# Patient Record
Sex: Male | Born: 1960
Health system: Southern US, Community
[De-identification: ages and names within clinical notes are randomized; demographics above are authoritative.]

## PROBLEM LIST (undated history)

## (undated) DIAGNOSIS — H3530 Unspecified macular degeneration: Secondary | ICD-10-CM

## (undated) HISTORY — DX: Unspecified macular degeneration: H35.30

---

## 2002-07-06 ENCOUNTER — Encounter: Payer: Self-pay | Admitting: Emergency Medicine

## 2002-07-06 ENCOUNTER — Emergency Department (HOSPITAL_COMMUNITY): Admission: EM | Admit: 2002-07-06 | Discharge: 2002-07-06 | Payer: Self-pay | Admitting: Emergency Medicine

## 2009-06-02 ENCOUNTER — Ambulatory Visit (HOSPITAL_BASED_OUTPATIENT_CLINIC_OR_DEPARTMENT_OTHER): Admission: RE | Admit: 2009-06-02 | Discharge: 2009-06-02 | Payer: Self-pay | Admitting: Orthopedic Surgery

## 2010-10-06 LAB — POCT HEMOGLOBIN-HEMACUE: Hemoglobin: 16.6 g/dL (ref 13.0–17.0)

## 2018-06-18 ENCOUNTER — Ambulatory Visit (HOSPITAL_COMMUNITY): Payer: BLUE CROSS/BLUE SHIELD | Attending: Sports Medicine | Admitting: Occupational Therapy

## 2018-06-18 ENCOUNTER — Encounter (HOSPITAL_COMMUNITY): Payer: Self-pay | Admitting: Occupational Therapy

## 2018-06-18 ENCOUNTER — Other Ambulatory Visit: Payer: Self-pay

## 2018-06-18 DIAGNOSIS — R29898 Other symptoms and signs involving the musculoskeletal system: Secondary | ICD-10-CM

## 2018-06-18 DIAGNOSIS — M25612 Stiffness of left shoulder, not elsewhere classified: Secondary | ICD-10-CM | POA: Insufficient documentation

## 2018-06-18 DIAGNOSIS — M25512 Pain in left shoulder: Secondary | ICD-10-CM | POA: Insufficient documentation

## 2018-06-18 NOTE — Patient Instructions (Signed)
SHOULDER: Flexion On Table   Place hands on towel placed on table, elbows straight. Lean forward with you upper body, pushing towel away from body.  _10__ reps per set, __3_ sets per day  Abduction (Passive)   With arm out to side, resting on towel placed on table, keeping trunk away from table, lean to the side while pushing towel away from body.  Repeat __10__ times. Do __3__ sessions per day.  Copyright  VHI. All rights reserved.     Internal Rotation (Assistive)   Seated with elbow bent at right angle and held against side, slide arm on table surface in an inward arc keeping elbow anchored in place. Repeat __10__ times. Do __2-3__ sessions per day. Activity: Use this motion to brush crumbs off the table.  Copyright  VHI. All rights reserved.     1) Seated Row   Sit up straight with elbows by your sides. Pull back with shoulders/elbows, keeping forearms straight, as if pulling back on the reins of a horse. Squeeze shoulder blades together. Repeat __10_times, __3__sets/day    2) Shoulder Elevation    Sit up straight with arms by your sides. Slowly bring your shoulders up towards your ears. Repeat_10__times, __3__ sets/day    3) Shoulder Extension    Sit up straight with both arms by your side, draw your arms back behind your waist. Keep your elbows straight. Repeat _10___times, _3___sets/day.
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
## 2018-06-21 ENCOUNTER — Encounter (HOSPITAL_COMMUNITY): Payer: Self-pay

## 2018-06-21 ENCOUNTER — Ambulatory Visit (HOSPITAL_COMMUNITY): Payer: BLUE CROSS/BLUE SHIELD

## 2018-06-21 DIAGNOSIS — M25512 Pain in left shoulder: Secondary | ICD-10-CM | POA: Diagnosis not present

## 2018-06-21 DIAGNOSIS — R29898 Other symptoms and signs involving the musculoskeletal system: Secondary | ICD-10-CM

## 2018-06-21 DIAGNOSIS — M25612 Stiffness of left shoulder, not elsewhere classified: Secondary | ICD-10-CM

## 2018-06-21 NOTE — Therapy (Signed)
[place], [place], [address] Phone: [phone]   Fax:  [phone]  Occupational Therapy Treatment  Patient Details  Name: Benjamin Gill MRN: [id number] Date of Birth: 1961/04/08 Referring Provider (OT): Dr. Becky Sax   Encounter Date: 06/21/2018  OT End of Session - 06/21/18 1454    Visit Number  2    Number of Visits  16    Date for OT Re-Evaluation  08/17/18   mini-reassessment 07/16/17   Authorization Type  BCBS    Authorization Time Period  no visit limit    OT Start Time  1356   pt was checked in late   OT Stop Time  1430    OT Time Calculation (min)  34 min    Activity Tolerance  Patient tolerated treatment well    Behavior During Therapy  [facility] for tasks assessed/performed       History reviewed. No pertinent past medical history.  History reviewed. No pertinent surgical history.  There were no vitals filed for this visit.  Subjective Assessment - 06/21/18 1423    Subjective   S: I can't sleep in the bed yet and i wish I could.     Special Tests  FOTO score: 42/100    Currently in Pain?  Yes    Pain Score  3     Pain Location  Shoulder    Pain Orientation  Left    Pain Descriptors / Indicators  Aching    Pain Type  Acute pain    Pain Radiating Towards  N/A    Pain Onset  Yesterday    Pain Frequency  Constant    Aggravating Factors   Wrong movements    Pain Relieving Factors  movement/stretching    Effect of Pain on Daily Activities  unable to use his left arm for any daily tasks.     Multiple Pain Sites  No         [facility] OT Assessment - 06/21/18 1421      Assessment   Medical Diagnosis  s/p closed displaced clavicle shaft fx      Precautions   Precautions  Shoulder    Type of Shoulder Precautions  genle ROM all planes-table slides, pendulums      Observation/Other Assessments   Focus on Therapeutic Outcomes (FOTO)   42/100               OT Treatments/Exercises (OP)  - 06/21/18 1426      Exercises   Exercises  Shoulder      Shoulder Exercises: Supine   Protraction  PROM;10 reps    Horizontal ABduction  PROM;10 reps    External Rotation  PROM;10 reps    Internal Rotation  PROM;10 reps    Flexion  PROM;10 reps    ABduction  PROM;10 reps      Shoulder Exercises: Seated   Row  AROM;10 reps    Other Seated Exercises  Scapular depression; 10X; A/ROM      Shoulder Exercises: Therapy Ball   Flexion  10 reps    ABduction  10 reps      Shoulder Exercises: ROM/Strengthening   Thumb Tacks  1' low level      Manual Therapy   Manual Therapy  Soft tissue mobilization    Manual therapy comments  Manual therapy completed prior to exercises.     Soft tissue mobilization  Myofascial release and manual stretching completed to left upper arm,  trapezius, and scapularis region to decrease fascial restrictions and increase joint mobility in a pain free zone.             OT Education - 06/21/18 1454    Education Details  reviewed therapy goals and treatment plan with patient.     Person(s) Educated  Patient    Methods  Explanation    Comprehension  Verbalized understanding       OT Short Term Goals - 06/21/18 1513      OT SHORT TERM GOAL #1   Title  Pt will be provided with and educated on HEP to improve ability to use LUE during ADL tasks.     Time  4    Period  Weeks    Status  On-going      OT SHORT TERM GOAL #2   Title  Pt will improve P/ROM of LUE to [facility] to improve ability to use LUE as assist during ADL completion.     Time  4    Period  Weeks    Status  On-going      OT SHORT TERM GOAL #3   Title  Pt will improve LUE strength to 3+/5 to improve ability to reach for items above waist level.     Time  4    Period  Weeks    Status  On-going      OT SHORT TERM GOAL #4   Title  Pt will decrease pain in LUE to 4/10 to improve ability to sleep in his bed.     Time  4    Period  Weeks    Status  On-going        OT Long Term Goals -  06/21/18 1513      OT LONG TERM GOAL #1   Title  Pt will return to highest level of functioning using LUE as non-dominant during ADL and work tasks.     Time  8    Period  Weeks    Status  On-going      OT LONG TERM GOAL #2   Title  Pt will decrease pain in LUE to 2/10 or less to improve ability to using LUE as non-dominant during dressing tasks.     Time  8    Period  Weeks    Status  On-going      OT LONG TERM GOAL #3   Title  Pt will improve A/ROM of LUE to [facility] to improve ability to reach for items overhead and behind back.     Time  8    Period  Weeks    Status  On-going      OT LONG TERM GOAL #4   Title  Pt will increase strength in LUE to 4+/5 to improve ability to lift and carry weighted objects during ADL and work tasks.     Time  8    Period  Weeks    Status  On-going      OT LONG TERM GOAL #5   Title  Pt will decrease LUE fascial restrictions from mod to minimal amounts or less to improve mobility required for functional reaching tasks.     Time  8    Period  Weeks    Status  On-going            Plan - 06/21/18 1457    Clinical Impression Statement  A: initiated myofascial release, manual stretching, and therapy ball stretching. patient with a large  trigger point in the left upper trapezius region with great results with trigger point release. Education completed regarding trigger points, muscle knots, and how they occur. VC for form and technique were provided during session.     Plan  P: Add isometrics supine.     Consulted and Agree with Plan of Care  Patient       Patient will benefit from skilled therapeutic intervention in order to improve the following deficits and impairments:  Decreased range of motion, Decreased activity tolerance, Increased fascial restrictions, Impaired UE functional use, Pain, Decreased mobility, Decreased strength  Visit Diagnosis: Acute pain of left shoulder  Stiffness of left shoulder, not elsewhere classified  Other  symptoms and signs involving the musculoskeletal system    Problem List There are no active problems to display for this patient.  Ailene Ravel, OTR/L,CBIS  [phone]  06/21/2018, 3:15 PM  [place] [address] [place], [place], [address] Phone: [phone]   Fax:  [phone]  Name: JOSHU FURUKAWA MRN: [id number] Date of Birth: [birth year]

## 2018-06-26 ENCOUNTER — Encounter (HOSPITAL_COMMUNITY): Payer: Self-pay | Admitting: Specialist

## 2018-06-26 ENCOUNTER — Ambulatory Visit (HOSPITAL_COMMUNITY): Payer: BLUE CROSS/BLUE SHIELD | Admitting: Specialist

## 2018-06-26 DIAGNOSIS — M25612 Stiffness of left shoulder, not elsewhere classified: Secondary | ICD-10-CM

## 2018-06-26 DIAGNOSIS — M25512 Pain in left shoulder: Secondary | ICD-10-CM | POA: Diagnosis not present

## 2018-06-26 DIAGNOSIS — R29898 Other symptoms and signs involving the musculoskeletal system: Secondary | ICD-10-CM

## 2018-06-26 NOTE — Patient Instructions (Signed)
  Hold each contraction 5-10" repeat 5 times each session.  Complete 2 sessions per day  Strengthening: Isometric Flexion  Using wall for resistance, press left fist into ball using light pressure. Hold ____ seconds. Repeat ____ times per set. Do ____ sets per session. Do ____ sessions per day.  SHOULDER: Abduction (Isometric)  Use wall as resistance. Press left arm against pillow. Keep elbow straight. Hold ___ seconds. ___ reps per set, ___ sets per day, ___ days per week  Extension (Isometric)  Place left bent elbow and back of arm against wall. Press elbow against wall. Hold ____ seconds. Repeat ____ times. Do ____ sessions per day.  Internal Rotation (Isometric)  Place palm of left fist against door frame, with elbow bent. Press fist against door frame. Hold ____ seconds. Repeat ____ times. Do ____ sessions per day.  External Rotation (Isometric)  Place back of left fist against door frame, with elbow bent. Press fist against door frame. Hold ____ seconds. Repeat ____ times. Do ____ sessions per day.  Copyright  VHI. All rights reserved.

## 2018-06-26 NOTE — Therapy (Signed)
[place], [place], [address] Phone: [phone]   Fax:  [phone]  Occupational Therapy Treatment  Patient Details  Name: Benjamin Gill MRN: [id number] Date of Birth: 16-Nov-1960 Referring Provider (OT): Dr. Becky Sax   Encounter Date: 06/26/2018  OT End of Session - 06/26/18 0935    Visit Number  3    Number of Visits  16    Date for OT Re-Evaluation  08/17/18   mini reassessment on 07/16/17   Authorization Type  BCBS    Authorization Time Period  no visit limit    OT Start Time  0900    OT Stop Time  0940    OT Time Calculation (min)  40 min    Activity Tolerance  Patient tolerated treatment well    Behavior During Therapy  [facility] for tasks assessed/performed       History reviewed. No pertinent past medical history.  History reviewed. No pertinent surgical history.  There were no vitals filed for this visit.  Subjective Assessment - 06/26/18 0934    Subjective   S:  I have some knots in the back of my shoulder blade, I rub it against the corner and that helps    Currently in Pain?  Yes    Pain Score  3     Pain Location  Shoulder    Pain Orientation  Left;Posterior    Pain Descriptors / Indicators  Aching;Burning         [facility] OT Assessment - 06/26/18 0001      Assessment   Medical Diagnosis  s/p closed displaced clavicle shaft fx      Precautions   Precautions  Shoulder    Type of Shoulder Precautions  genle ROM all planes-table slides, pendulums               OT Treatments/Exercises (OP) - 06/26/18 0001      Shoulder Exercises: Supine   Protraction  PROM;10 reps    Horizontal ABduction  PROM;10 reps    External Rotation  PROM;10 reps    Internal Rotation  PROM;10 reps    Flexion  PROM;10 reps    ABduction  PROM;10 reps    Other Supine Exercises  bridges X15      Shoulder Exercises: Seated   Elevation  AROM;12 reps    Extension  AROM;12 reps    Row  AROM;12 reps      Shoulder Exercises: Therapy Ball   Flexion  15 reps    ABduction  15 reps      Shoulder Exercises: ROM/Strengthening   Thumb Tacks  1' low level      Shoulder Exercises: Isometric Strengthening   Flexion  Supine;3X3"    Extension  Supine;3X3"    External Rotation  Supine;3X3"    Internal Rotation  Supine;3X3"    ABduction  Supine;3X3"    ADduction  Supine;3X3"      Manual Therapy   Manual Therapy  Myofascial release    Manual therapy comments  Manual therapy completed prior to exercises.     Soft tissue mobilization  Myofascial release and manual stretching completed to left upper arm, trapezius, and scapularis region to decrease fascial restrictions and increase joint mobility in a pain free zone.    Myofascial Release  myofascial release and manual traction to cervical region and left trapezius region to decrease pain and restrictions and improve pain free mobility  OT Education - 06/26/18 0933    Education Details  isometric strengthening in standing     Person(s) Educated  Patient    Methods  Explanation;Demonstration;Handout    Comprehension  Verbalized understanding;Returned demonstration       OT Short Term Goals - 06/21/18 1513      OT SHORT TERM GOAL #1   Title  Pt will be provided with and educated on HEP to improve ability to use LUE during ADL tasks.     Time  4    Period  Weeks    Status  On-going      OT SHORT TERM GOAL #2   Title  Pt will improve P/ROM of LUE to [facility] to improve ability to use LUE as assist during ADL completion.     Time  4    Period  Weeks    Status  On-going      OT SHORT TERM GOAL #3   Title  Pt will improve LUE strength to 3+/5 to improve ability to reach for items above waist level.     Time  4    Period  Weeks    Status  On-going      OT SHORT TERM GOAL #4   Title  Pt will decrease pain in LUE to 4/10 to improve ability to sleep in his bed.     Time  4    Period  Weeks    Status  On-going        OT Long  Term Goals - 06/21/18 1513      OT LONG TERM GOAL #1   Title  Pt will return to highest level of functioning using LUE as non-dominant during ADL and work tasks.     Time  8    Period  Weeks    Status  On-going      OT LONG TERM GOAL #2   Title  Pt will decrease pain in LUE to 2/10 or less to improve ability to using LUE as non-dominant during dressing tasks.     Time  8    Period  Weeks    Status  On-going      OT LONG TERM GOAL #3   Title  Pt will improve A/ROM of LUE to [facility] to improve ability to reach for items overhead and behind back.     Time  8    Period  Weeks    Status  On-going      OT LONG TERM GOAL #4   Title  Pt will increase strength in LUE to 4+/5 to improve ability to lift and carry weighted objects during ADL and work tasks.     Time  8    Period  Weeks    Status  On-going      OT LONG TERM GOAL #5   Title  Pt will decrease LUE fascial restrictions from mod to minimal amounts or less to improve mobility required for functional reaching tasks.     Time  8    Period  Weeks    Status  On-going            Plan - 06/26/18 0939    Clinical Impression Statement  A: Initiated isometric strengthening in supine, 3X3" hold.  Patient completed with good form and technique, therefore added to HEP.  Manual therapy indicated for scapular region and cervical region due to large fascial restrictions present and causing increased pain and restrictions.  Plan  P:  Increase isometric contraction to 5" or greater.  Continue manual therapy for improved P/ROM required to regain functional use of left arm during ADL completion.        Patient will benefit from skilled therapeutic intervention in order to improve the following deficits and impairments:  Decreased range of motion, Decreased activity tolerance, Increased fascial restrictions, Impaired UE functional use, Pain, Decreased mobility, Decreased strength  Visit Diagnosis: Acute pain of left shoulder  Stiffness of  left shoulder, not elsewhere classified  Other symptoms and signs involving the musculoskeletal system    Problem List There are no active problems to display for this patient.   Vangie Bicker, Vincent, OTR/L [phone]  06/26/2018, 9:43 AM  [place] [address] [place], [place], [address] Phone: [phone]   Fax:  [phone]  Name: Benjamin Gill MRN: [id number] Date of Birth: Aug 06, 1960

## 2018-06-29 ENCOUNTER — Ambulatory Visit (HOSPITAL_COMMUNITY): Payer: BLUE CROSS/BLUE SHIELD | Admitting: Specialist

## 2018-06-29 ENCOUNTER — Encounter (HOSPITAL_COMMUNITY): Payer: Self-pay | Admitting: Specialist

## 2018-06-29 DIAGNOSIS — M25512 Pain in left shoulder: Secondary | ICD-10-CM

## 2018-06-29 DIAGNOSIS — M25612 Stiffness of left shoulder, not elsewhere classified: Secondary | ICD-10-CM

## 2018-06-29 DIAGNOSIS — R29898 Other symptoms and signs involving the musculoskeletal system: Secondary | ICD-10-CM

## 2018-06-29 NOTE — Therapy (Signed)
[place], [place], [address] Phone: [phone]   Fax:  [phone]  Occupational Therapy Treatment  Patient Details  Name: Benjamin Gill MRN: [id number] Date of Birth: 06-Dec-1960 Referring Provider (OT): Dr. Becky Sax   Encounter Date: 06/29/2018  OT End of Session - 06/29/18 0944    Visit Number  4    Number of Visits  16    Date for OT Re-Evaluation  08/17/18   mini reassessment on 07/16/18   Authorization Type  [employer]    Authorization Time Period  no visit limit    OT Start Time  0905    OT Stop Time  0950    OT Time Calculation (min)  45 min    Activity Tolerance  Patient tolerated treatment well    Behavior During Therapy  [facility] for tasks assessed/performed       History reviewed. No pertinent past medical history.  History reviewed. No pertinent surgical history.  There were no vitals filed for this visit.  Subjective Assessment - 06/29/18 0944    Subjective   S:  I really like the bridges, they make my shoulder feel good.  I have been doing the isometrics at home.     Currently in Pain?  Yes    Pain Score  3     Pain Location  Shoulder    Pain Orientation  Left;Posterior    Pain Descriptors / Indicators  Aching                   OT Treatments/Exercises (OP) - 06/29/18 0001      Exercises   Exercises  Shoulder      Shoulder Exercises: Supine   Protraction  PROM;5 reps;AAROM;10 reps    Horizontal ABduction  PROM;5 reps;AAROM;10 reps    External Rotation  PROM;5 reps;AAROM;10 reps    Internal Rotation  PROM;5 reps;AAROM;10 reps    Flexion  PROM;5 reps;AAROM;10 reps    ABduction  PROM;5 reps;AAROM;10 reps    Other Supine Exercises  bridges X 20      Shoulder Exercises: Seated   Elevation  AROM;15 reps    Extension  AROM;15 reps    Row  AROM;15 reps      Shoulder Exercises: ROM/Strengthening   Thumb Tacks  1' low level    Prot/Ret//Elev/Dep  1' low level      Shoulder  Exercises: Isometric Strengthening   Flexion  Supine;5X5"    Extension  Supine;5X5"    External Rotation  Supine;5X5"    Internal Rotation  Supine;5X5"    ABduction  Supine;5X5"    ADduction  Supine;5X5"      Manual Therapy   Manual Therapy  Myofascial release    Manual therapy comments  Manual therapy completed prior to exercises.     Soft tissue mobilization  Myofascial release and manual stretching completed to left upper arm, trapezius, and scapularis region to decrease fascial restrictions and increase joint mobility in a pain free zone.    Myofascial Release  myofascial release and manual traction to cervical region and left trapezius region to decrease pain and restrictions and improve pain free mobility              OT Education - 06/29/18 0954    Education Details  cervical flexibility stretches    Person(s) Educated  Patient    Methods  Explanation;Demonstration;Handout    Comprehension  Verbalized understanding;Returned demonstration  OT Short Term Goals - 06/21/18 1513      OT SHORT TERM GOAL #1   Title  Pt will be provided with and educated on HEP to improve ability to use LUE during ADL tasks.     Time  4    Period  Weeks    Status  On-going      OT SHORT TERM GOAL #2   Title  Pt will improve P/ROM of LUE to [facility] to improve ability to use LUE as assist during ADL completion.     Time  4    Period  Weeks    Status  On-going      OT SHORT TERM GOAL #3   Title  Pt will improve LUE strength to 3+/5 to improve ability to reach for items above waist level.     Time  4    Period  Weeks    Status  On-going      OT SHORT TERM GOAL #4   Title  Pt will decrease pain in LUE to 4/10 to improve ability to sleep in his bed.     Time  4    Period  Weeks    Status  On-going        OT Long Term Goals - 06/21/18 1513      OT LONG TERM GOAL #1   Title  Pt will return to highest level of functioning using LUE as non-dominant during ADL and work tasks.      Time  8    Period  Weeks    Status  On-going      OT LONG TERM GOAL #2   Title  Pt will decrease pain in LUE to 2/10 or less to improve ability to using LUE as non-dominant during dressing tasks.     Time  8    Period  Weeks    Status  On-going      OT LONG TERM GOAL #3   Title  Pt will improve A/ROM of LUE to [facility] to improve ability to reach for items overhead and behind back.     Time  8    Period  Weeks    Status  On-going      OT LONG TERM GOAL #4   Title  Pt will increase strength in LUE to 4+/5 to improve ability to lift and carry weighted objects during ADL and work tasks.     Time  8    Period  Weeks    Status  On-going      OT LONG TERM GOAL #5   Title  Pt will decrease LUE fascial restrictions from mod to minimal amounts or less to improve mobility required for functional reaching tasks.     Time  8    Period  Weeks    Status  On-going            Plan - 06/29/18 7169    Clinical Impression Statement  A:  Increased contraction time for isometrics to 5 X 5".  P/ROM is WFL, therefore added AA/ROM this date in supine. Also added cervical stretches as patient feeling a good deal of tightness and pain in his cervical region and left upper trapezius.    Plan  P:  Follow up on HEP, add aa/rom in seated.  Continue to improve left arm range and strength needed to regain full functional use of left arm for ADL tasks.     Consulted and Agree with Plan  of Care  Patient       Patient will benefit from skilled therapeutic intervention in order to improve the following deficits and impairments:  Decreased range of motion, Decreased activity tolerance, Increased fascial restrictions, Impaired UE functional use, Pain, Decreased mobility, Decreased strength  Visit Diagnosis: Acute pain of left shoulder  Stiffness of left shoulder, not elsewhere classified  Other symptoms and signs involving the musculoskeletal system    Problem List There are no active problems to display  for this patient.   Vangie Bicker, [place], OTR/L [phone]  06/29/2018, 10:00 AM  [place], [place], [address] Phone: [phone]   Fax:  [phone]  Name: HEATHER STREEPER MRN: [id number] Date of Birth: 1960-09-06

## 2018-06-29 NOTE — Patient Instructions (Addendum)
Flexibility: Neck Stretch   Grasp left arm above wrist and pull down across body while gently tilting head same direction. Hold for 3 seconds. Repeat 10 times.   Levator Scapula Stretch   Place left hand on same side shoulder blade. With other hand, gently stretch head down and away. Hold 3 seconds. Repeat 10 times.   Flexibility: Neck Stretch   Grasp left arm above wrist and pull down across body while gently tilting head same direction. Hold 3 seconds. Repeat 10 times.  Flexibility: Neck Retraction   Pull head straight back, keeping eyes and jaw level. *Give yourself a double chin.* Hold 3 seconds. Repeat 10 times.   Lower Cervical / Upper Thoracic Stretch   Clasp hands together in front with arms extended. Gently pull shoulder blades apart and bend head forward. Hold 3 seconds. Repeat 10 times.        Flexibility: Upper Trapezius Stretch   Gently grasp right side of head while reaching behind back with other hand. Tilt head away until a gentle stretch is felt. Hold ____ seconds. Repeat ____ times per set. Do ____ sets per session. Do ____ sessions per day.  http://orth.exer.us/340   Levator Stretch   Grasp seat or sit on hand on side to be stretched. Turn head toward other side and look down. Use hand on head to gently stretch neck in that position. Hold ____ seconds. Repeat on other side. Repeat ____ times. Do ____ sessions per day.  http://[email or web address].exer.us/30   Scapular Retraction (Standing)   With arms at sides, pinch shoulder blades together. Repeat ____ times per set. Do ____ sets per session. Do ____ sessions per day.  http://orth.exer.us/944   Flexibility: Neck Retraction   Pull head straight back, keeping eyes and jaw level. Repeat ____ times per set. Do ____ sets per session. Do ____ sessions per day.  http://orth.exer.us/344   Posture - Sitting   Sit upright, head facing forward. Try using a roll to support lower back. Keep shoulders  relaxed, and avoid rounded back. Keep hips level with knees. Avoid crossing legs for long periods.   Flexibility: Corner Stretch   Standing in corner with hands just above shoulder level and feet ____ inches from corner, lean forward until a comfortable stretch is felt across chest. Hold ____ seconds. Repeat ____ times per set. Do ____ sets per session. Do ____ sessions per day.  http://orth.exer.us/342   Copyright  VHI. All rights reserved.

## 2018-07-02 ENCOUNTER — Telehealth (HOSPITAL_COMMUNITY): Payer: Self-pay | Admitting: *Deleted

## 2018-07-02 NOTE — Telephone Encounter (Signed)
07/02/18  has a drs appt in [place] and needs an early morning or late afternoon appt on Thursday if something comes open or on Friday

## 2018-07-03 ENCOUNTER — Encounter (HOSPITAL_COMMUNITY): Payer: Self-pay

## 2018-07-03 ENCOUNTER — Ambulatory Visit (HOSPITAL_COMMUNITY): Payer: BLUE CROSS/BLUE SHIELD

## 2018-07-03 DIAGNOSIS — M25612 Stiffness of left shoulder, not elsewhere classified: Secondary | ICD-10-CM

## 2018-07-03 DIAGNOSIS — R29898 Other symptoms and signs involving the musculoskeletal system: Secondary | ICD-10-CM

## 2018-07-03 DIAGNOSIS — M25512 Pain in left shoulder: Secondary | ICD-10-CM | POA: Diagnosis not present

## 2018-07-03 NOTE — Patient Instructions (Signed)

## 2018-07-03 NOTE — Therapy (Signed)
[place], [place], [address] Phone: [phone]   Fax:  [phone]  Occupational Therapy Treatment  Patient Details  Name: Benjamin Gill MRN: [id number] Date of Birth: 11/26/1960 Referring Provider (OT): Dr. Becky Sax   Encounter Date: 07/03/2018  OT End of Session - 07/03/18 1445    Visit Number  5    Number of Visits  16    Date for OT Re-Evaluation  08/17/18   mini reassessment on 07/16/18   Authorization Type  BCBS    Authorization Time Period  no visit limit    OT Start Time  1301    OT Stop Time  1345    OT Time Calculation (min)  44 min    Activity Tolerance  Patient tolerated treatment well    Behavior During Therapy  [facility] for tasks assessed/performed       History reviewed. No pertinent past medical history.  History reviewed. No pertinent surgical history.  There were no vitals filed for this visit.  Subjective Assessment - 07/03/18 1319    Subjective   S: It's just a little sore today.    Currently in Pain?  Yes    Pain Score  3     Pain Location  Shoulder    Pain Orientation  Left    Pain Descriptors / Indicators  Aching;Sore    Pain Type  Acute pain    Pain Radiating Towards  N/A    Pain Onset  In the past 7 days    Pain Frequency  Constant    Aggravating Factors   wrong movements    Pain Relieving Factors  movment/stretching    Effect of Pain on Daily Activities  moderate effect    Multiple Pain Sites  No         [facility] OT Assessment - 07/03/18 1320      Assessment   Medical Diagnosis  s/p closed displaced clavicle shaft fx      Precautions   Precautions  Shoulder    Type of Shoulder Precautions  genle ROM all planes-table slides, pendulums               OT Treatments/Exercises (OP) - 07/03/18 1320      Exercises   Exercises  Shoulder      Shoulder Exercises: Supine   Protraction  PROM;5 reps;AAROM;10 reps    Horizontal ABduction  PROM;5 reps;AAROM;10 reps     External Rotation  PROM;5 reps;AAROM;10 reps    Internal Rotation  PROM;5 reps;AAROM;10 reps    Flexion  PROM;5 reps;AAROM;10 reps    ABduction  PROM;5 reps;AAROM;10 reps    Other Supine Exercises  bridges X 20      Shoulder Exercises: Pulleys   Flexion  1 minute    ABduction  1 minute      Shoulder Exercises: ROM/Strengthening   Thumb Tacks  1'      Manual Therapy   Manual Therapy  Myofascial release    Manual therapy comments  Manual therapy completed prior to exercises.     Soft tissue mobilization  Myofascial release and manual stretching completed to left upper arm, trapezius, and scapularis region to decrease fascial restrictions and increase joint mobility in a pain free zone.             OT Education - 07/03/18 1333    Education Details  AA/ROM standing. Continue with cervical stretches. Stop previous exercises.     Person(s) Educated  Patient    Methods  Demonstration;Explanation;Verbal cues;Handout    Comprehension  Returned demonstration;Verbalized understanding       OT Short Term Goals - 06/21/18 1513      OT SHORT TERM GOAL #1   Title  Pt will be provided with and educated on HEP to improve ability to use LUE during ADL tasks.     Time  4    Period  Weeks    Status  On-going      OT SHORT TERM GOAL #2   Title  Pt will improve P/ROM of LUE to [employer] to improve ability to use LUE as assist during ADL completion.     Time  4    Period  Weeks    Status  On-going      OT SHORT TERM GOAL #3   Title  Pt will improve LUE strength to 3+/5 to improve ability to reach for items above waist level.     Time  4    Period  Weeks    Status  On-going      OT SHORT TERM GOAL #4   Title  Pt will decrease pain in LUE to 4/10 to improve ability to sleep in his bed.     Time  4    Period  Weeks    Status  On-going        OT Long Term Goals - 06/21/18 1513      OT LONG TERM GOAL #1   Title  Pt will return to highest level of functioning using LUE as  non-dominant during ADL and work tasks.     Time  8    Period  Weeks    Status  On-going      OT LONG TERM GOAL #2   Title  Pt will decrease pain in LUE to 2/10 or less to improve ability to using LUE as non-dominant during dressing tasks.     Time  8    Period  Weeks    Status  On-going      OT LONG TERM GOAL #3   Title  Pt will improve A/ROM of LUE to [facility] to improve ability to reach for items overhead and behind back.     Time  8    Period  Weeks    Status  On-going      OT LONG TERM GOAL #4   Title  Pt will increase strength in LUE to 4+/5 to improve ability to lift and carry weighted objects during ADL and work tasks.     Time  8    Period  Weeks    Status  On-going      OT LONG TERM GOAL #5   Title  Pt will decrease LUE fascial restrictions from mod to minimal amounts or less to improve mobility required for functional reaching tasks.     Time  8    Period  Weeks    Status  On-going            Plan - 07/03/18 1445    Clinical Impression Statement  A: Completed AA/ROM supine and standing this date with VC for form and technique. patient required cueing for modifications as needed in order to complete successfully within his pain tolerance. HEP was updated. Moderate fascial restrictions noted in upper trapezius region with manual techniques completed to address.     Plan  P: Add PVC pipe slide. Follow up on HEP.     Consulted  and Agree with Plan of Care  Patient       Patient will benefit from skilled therapeutic intervention in order to improve the following deficits and impairments:  Decreased range of motion, Decreased activity tolerance, Increased fascial restrictions, Impaired UE functional use, Pain, Decreased mobility, Decreased strength  Visit Diagnosis: Acute pain of left shoulder  Stiffness of left shoulder, not elsewhere classified  Other symptoms and signs involving the musculoskeletal system    Problem List There are no active problems to display  for this patient.  Ailene Ravel, OTR/L,CBIS  [phone]  07/03/2018, 2:52 PM  [place] [address] [place], [place], [address] Phone: [phone]   Fax:  [phone]  Name: Benjamin Gill MRN: [id number] Date of Birth: Oct 19, 1960

## 2018-07-05 ENCOUNTER — Ambulatory Visit (HOSPITAL_COMMUNITY): Payer: BLUE CROSS/BLUE SHIELD | Attending: Sports Medicine

## 2018-07-05 ENCOUNTER — Encounter (HOSPITAL_COMMUNITY): Payer: Self-pay

## 2018-07-05 ENCOUNTER — Encounter (HOSPITAL_COMMUNITY): Payer: BLUE CROSS/BLUE SHIELD | Admitting: Occupational Therapy

## 2018-07-05 DIAGNOSIS — R29898 Other symptoms and signs involving the musculoskeletal system: Secondary | ICD-10-CM | POA: Diagnosis present

## 2018-07-05 DIAGNOSIS — M25612 Stiffness of left shoulder, not elsewhere classified: Secondary | ICD-10-CM | POA: Diagnosis present

## 2018-07-05 DIAGNOSIS — M25512 Pain in left shoulder: Secondary | ICD-10-CM | POA: Diagnosis present

## 2018-07-05 NOTE — Therapy (Signed)
[place], [place], [address] Phone: [phone]   Fax:  [phone]  Occupational Therapy Treatment  Patient Details  Name: Benjamin Gill MRN: [id number] Date of Birth: [birth year] Referring Provider (OT): Dr. Becky Sax   Encounter Date: 07/05/2018  OT End of Session - 07/05/18 1732    Visit Number  6    Number of Visits  16    Date for OT Re-Evaluation  08/17/18   mini reassessment on 07/16/18   Authorization Type  BCBS    Authorization Time Period  no visit limit    OT Start Time  1604    OT Stop Time  1645    OT Time Calculation (min)  41 min    Activity Tolerance  Patient tolerated treatment well    Behavior During Therapy  [facility] for tasks assessed/performed       History reviewed. No pertinent past medical history.  History reviewed. No pertinent surgical history.  There were no vitals filed for this visit.  Subjective Assessment - 07/05/18 1606    Subjective   S: It just depends on the exercise and movement.    Currently in Pain?  Yes    Pain Score  3     Pain Location  Shoulder    Pain Orientation  Left    Pain Descriptors / Indicators  Sore;Aching    Pain Type  Acute pain    Pain Radiating Towards  N/A    Pain Onset  In the past 7 days    Pain Frequency  Intermittent         [facility] OT Assessment - 07/05/18 1617      Assessment   Medical Diagnosis  s/p closed displaced clavicle shaft fx      Precautions   Precautions  Shoulder    Type of Shoulder Precautions  genle ROM all planes-table slides, pendulums               OT Treatments/Exercises (OP) - 07/05/18 1626      Exercises   Exercises  Shoulder      Shoulder Exercises: Supine   Protraction  PROM;5 reps;AAROM;10 reps    Horizontal ABduction  PROM;5 reps;AAROM;10 reps    External Rotation  PROM;5 reps;AAROM;10 reps    Internal Rotation  PROM;5 reps;AAROM;10 reps    Flexion  PROM;5 reps;AAROM;10 reps    ABduction   PROM;5 reps;AAROM;10 reps    Other Supine Exercises  bridges X 20      Shoulder Exercises: Pulleys   Flexion  1 minute    ABduction  1 minute      Shoulder Exercises: Therapy Ball   Flexion  15 reps    ABduction  15 reps      Shoulder Exercises: ROM/Strengthening   Thumb Tacks  1'    Proximal Shoulder Strengthening, Supine  10X A/ROM no rest breaks    Prot/Ret//Elev/Dep  1'               OT Short Term Goals - 06/21/18 1513      OT SHORT TERM GOAL #1   Title  Pt will be provided with and educated on HEP to improve ability to use LUE during ADL tasks.     Time  4    Period  Weeks    Status  On-going      OT SHORT TERM GOAL #2   Title  Pt will improve P/ROM of LUE to [facility]  to improve ability to use LUE as assist during ADL completion.     Time  4    Period  Weeks    Status  On-going      OT SHORT TERM GOAL #3   Title  Pt will improve LUE strength to 3+/5 to improve ability to reach for items above waist level.     Time  4    Period  Weeks    Status  On-going      OT SHORT TERM GOAL #4   Title  Pt will decrease pain in LUE to 4/10 to improve ability to sleep in his bed.     Time  4    Period  Weeks    Status  On-going        OT Long Term Goals - 06/21/18 1513      OT LONG TERM GOAL #1   Title  Pt will return to highest level of functioning using LUE as non-dominant during ADL and work tasks.     Time  8    Period  Weeks    Status  On-going      OT LONG TERM GOAL #2   Title  Pt will decrease pain in LUE to 2/10 or less to improve ability to using LUE as non-dominant during dressing tasks.     Time  8    Period  Weeks    Status  On-going      OT LONG TERM GOAL #3   Title  Pt will improve A/ROM of LUE to [facility] to improve ability to reach for items overhead and behind back.     Time  8    Period  Weeks    Status  On-going      OT LONG TERM GOAL #4   Title  Pt will increase strength in LUE to 4+/5 to improve ability to lift and carry weighted objects  during ADL and work tasks.     Time  8    Period  Weeks    Status  On-going      OT LONG TERM GOAL #5   Title  Pt will decrease LUE fascial restrictions from mod to minimal amounts or less to improve mobility required for functional reaching tasks.     Time  8    Period  Weeks    Status  On-going            Plan - 07/05/18 1732    Clinical Impression Statement  A: Patient completed all exercises with VC for form and technique as needed. Continues to have max fascial restrictions in left upper trapezius region proximal to the lateral cervical region. Added proximal shoulder strengthening supine and standing to increase shoulder stability.     Plan  P: Measure for MD appointment and send progress note. Complete FOTO. Add pvc pipe slide.     Consulted and Agree with Plan of Care  Patient       Patient will benefit from skilled therapeutic intervention in order to improve the following deficits and impairments:  Decreased range of motion, Decreased activity tolerance, Increased fascial restrictions, Impaired UE functional use, Pain, Decreased mobility, Decreased strength  Visit Diagnosis: Acute pain of left shoulder  Stiffness of left shoulder, not elsewhere classified  Other symptoms and signs involving the musculoskeletal system    Problem List There are no active problems to display for this patient.  Ailene Ravel, OTR/L,CBIS  [phone]  07/05/2018, 5:35 PM  [place]  [place], [place], [address] Phone: [phone]   Fax:  [phone]  Name: Benjamin Gill MRN: [id number] Date of Birth: May 22, 1961

## 2018-07-06 ENCOUNTER — Encounter

## 2018-07-10 ENCOUNTER — Encounter (HOSPITAL_COMMUNITY): Payer: Self-pay

## 2018-07-10 ENCOUNTER — Ambulatory Visit (HOSPITAL_COMMUNITY): Payer: BLUE CROSS/BLUE SHIELD

## 2018-07-10 DIAGNOSIS — M25512 Pain in left shoulder: Secondary | ICD-10-CM

## 2018-07-10 DIAGNOSIS — R29898 Other symptoms and signs involving the musculoskeletal system: Secondary | ICD-10-CM

## 2018-07-10 DIAGNOSIS — M25612 Stiffness of left shoulder, not elsewhere classified: Secondary | ICD-10-CM

## 2018-07-10 NOTE — Therapy (Signed)
[place], [place], [address] Phone: [phone]   Fax:  [phone]  Occupational Therapy Treatment  Patient Details  Name: Benjamin Gill MRN: [id number] Date of Birth: 03-22-1961 Referring Provider (OT): Dr. Becky Sax   Encounter Date: 07/10/2018  OT End of Session - 07/10/18 1157    Visit Number  7    Number of Visits  16    Date for OT Re-Evaluation  08/17/18   mini reassessment on 07/16/18   Authorization Type  [employer]    Authorization Time Period  no visit limit    OT Start Time  1127    OT Stop Time  1205    OT Time Calculation (min)  38 min    Activity Tolerance  Patient tolerated treatment well    Behavior During Therapy  [facility] for tasks assessed/performed       History reviewed. No pertinent past medical history.  History reviewed. No pertinent surgical history.  There were no vitals filed for this visit.  Subjective Assessment - 07/10/18 1144    Subjective   S: The doctor's appointment was this morning. I forgot.     Currently in Pain?  No/denies         [facility] OT Assessment - 07/10/18 1145      Assessment   Medical Diagnosis  s/p closed displaced clavicle shaft fx      Precautions   Precautions  Shoulder    Type of Shoulder Precautions  Gentle progressive weightbearing allowed over the next 2 weeks. Per MD note. 07/10/18               OT Treatments/Exercises (OP) - 07/10/18 1146      Exercises   Exercises  Shoulder      Shoulder Exercises: Supine   Protraction  PROM;5 reps;AROM;10 reps    Horizontal ABduction  PROM;5 reps;AROM;10 reps    External Rotation  PROM;5 reps;AAROM;12 reps    Internal Rotation  PROM;5 reps;AAROM;12 reps    Flexion  PROM;5 reps;AAROM;12 reps    ABduction  PROM;5 reps;AAROM;12 reps;Limitations    ABduction Limitations  to 90 degrees      Shoulder Exercises: Standing   Protraction  AROM;10 reps    Horizontal ABduction  AAROM;10 reps    External  Rotation  AROM;10 reps    Internal Rotation  AROM;10 reps    Flexion  AROM;10 reps    ABduction  AAROM;10 reps    Extension  Theraband;10 reps    Theraband Level (Shoulder Extension)  Level 2 (Red)    Row  Theraband;10 reps    Theraband Level (Shoulder Row)  Level 2 (Red)      Shoulder Exercises: Pulleys   Flexion  1 minute    ABduction  1 minute      Shoulder Exercises: ROM/Strengthening   Wall Wash  1'    Proximal Shoulder Strengthening, Supine  12X A/ROM no rest breaks    Proximal Shoulder Strengthening, Seated  10X with no rest breaks    Other ROM/Strengthening Exercises  PVC pipe slide; 10X             OT Education - 07/10/18 1220    Education Details  Pt educated to continue completing AA/ROM horizontal abduction and abduction standing. Complete remaining exercises from HEP as A/ROM.     Person(s) Educated  Patient    Methods  Explanation    Comprehension  Verbalized understanding  OT Short Term Goals - 06/21/18 1513      OT SHORT TERM GOAL #1   Title  Pt will be provided with and educated on HEP to improve ability to use LUE during ADL tasks.     Time  4    Period  Weeks    Status  On-going      OT SHORT TERM GOAL #2   Title  Pt will improve P/ROM of LUE to [facility] to improve ability to use LUE as assist during ADL completion.     Time  4    Period  Weeks    Status  On-going      OT SHORT TERM GOAL #3   Title  Pt will improve LUE strength to 3+/5 to improve ability to reach for items above waist level.     Time  4    Period  Weeks    Status  On-going      OT SHORT TERM GOAL #4   Title  Pt will decrease pain in LUE to 4/10 to improve ability to sleep in his bed.     Time  4    Period  Weeks    Status  On-going        OT Long Term Goals - 06/21/18 1513      OT LONG TERM GOAL #1   Title  Pt will return to highest level of functioning using LUE as non-dominant during ADL and work tasks.     Time  8    Period  Weeks    Status  On-going       OT LONG TERM GOAL #2   Title  Pt will decrease pain in LUE to 2/10 or less to improve ability to using LUE as non-dominant during dressing tasks.     Time  8    Period  Weeks    Status  On-going      OT LONG TERM GOAL #3   Title  Pt will improve A/ROM of LUE to [facility] to improve ability to reach for items overhead and behind back.     Time  8    Period  Weeks    Status  On-going      OT LONG TERM GOAL #4   Title  Pt will increase strength in LUE to 4+/5 to improve ability to lift and carry weighted objects during ADL and work tasks.     Time  8    Period  Weeks    Status  On-going      OT LONG TERM GOAL #5   Title  Pt will decrease LUE fascial restrictions from mod to minimal amounts or less to improve mobility required for functional reaching tasks.     Time  8    Period  Weeks    Status  On-going            Plan - 07/10/18 1221    Clinical Impression Statement  A: Refrained from Myofascial release due to starting session late. No pain reported at start of session. patient reports that his MD appointment was this AM so measurements were not taken at today's appointment. Per MD note, patient is able to complete progressive weightbearing, continue therapy and return in 4-6 weeks for a follow up. Progressed to A/ROM shoulder exercises as patient was able to tolerate. Muscle soreness noted during some exercises and monitored. VC for form and technique were completed.     Plan  P: Add  retraction with red theraband. Add overhead lacing.     Consulted and Agree with Plan of Care  Patient       Patient will benefit from skilled therapeutic intervention in order to improve the following deficits and impairments:  Decreased range of motion, Decreased activity tolerance, Increased fascial restrictions, Impaired UE functional use, Pain, Decreased mobility, Decreased strength  Visit Diagnosis: Acute pain of left shoulder  Stiffness of left shoulder, not elsewhere classified  Other  symptoms and signs involving the musculoskeletal system    Problem List There are no active problems to display for this patient.   Ailene Ravel, OTR/L,CBIS  [phone]  07/10/2018, 12:23 PM  [place] [address] [place], [place], [address] Phone: [phone]   Fax:  [phone]  Name: Benjamin Gill MRN: [id number] Date of Birth: [birth year]

## 2018-07-12 ENCOUNTER — Ambulatory Visit (HOSPITAL_COMMUNITY): Payer: BLUE CROSS/BLUE SHIELD | Admitting: Occupational Therapy

## 2018-07-12 ENCOUNTER — Encounter (HOSPITAL_COMMUNITY): Payer: Self-pay | Admitting: Occupational Therapy

## 2018-07-12 DIAGNOSIS — M25512 Pain in left shoulder: Secondary | ICD-10-CM

## 2018-07-12 DIAGNOSIS — M25612 Stiffness of left shoulder, not elsewhere classified: Secondary | ICD-10-CM

## 2018-07-12 DIAGNOSIS — R29898 Other symptoms and signs involving the musculoskeletal system: Secondary | ICD-10-CM

## 2018-07-12 NOTE — Therapy (Signed)
[place], [place], [address] Phone: [phone]   Fax:  [phone]  Occupational Therapy Treatment  Patient Details  Name: SMITH POTENZA MRN: [id number] Date of Birth: Apr 18, 1961 Referring Provider (OT): Dr. Becky Sax   Encounter Date: 07/12/2018  OT End of Session - 07/12/18 1155    Visit Number  8    Number of Visits  16    Date for OT Re-Evaluation  08/17/18   mini reassessment on 07/16/18   Authorization Type  [employer]BS    Authorization Time Period  no visit limit    OT Start Time  1117    OT Stop Time  1155    OT Time Calculation (min)  38 min    Activity Tolerance  Patient tolerated treatment well    Behavior During Therapy  [facility] for tasks assessed/performed       History reviewed. No pertinent past medical history.  History reviewed. No pertinent surgical history.  There were no vitals filed for this visit.  Subjective Assessment - 07/12/18 1118    Subjective   S: It's a little achy today, I think I slept on it wrong.     Currently in Pain?  Yes    Pain Score  3     Pain Location  Shoulder    Pain Orientation  Left    Pain Descriptors / Indicators  Aching;Sore    Pain Type  Acute pain    Pain Radiating Towards  N/A    Pain Onset  In the past 7 days    Pain Frequency  Intermittent    Aggravating Factors   wrong movements, sleeping wrong    Pain Relieving Factors  movement/stretching    Effect of Pain on Daily Activities  moderate effect    Multiple Pain Sites  No         [facility] OT Assessment - 07/12/18 1117      Assessment   Medical Diagnosis  s/p closed displaced clavicle shaft fx      Precautions   Precautions  Shoulder    Type of Shoulder Precautions  Gentle progressive weightbearing allowed over the next 2 weeks. Per MD note. 07/10/18               OT Treatments/Exercises (OP) - 07/12/18 1120      Exercises   Exercises  Shoulder      Shoulder Exercises: Supine   Protraction  PROM;5 reps;AROM;10 reps    Horizontal ABduction  PROM;5 reps;AROM;10 reps    External Rotation  PROM;5 reps;AAROM;12 reps    Internal Rotation  PROM;5 reps;AAROM;12 reps    Flexion  PROM;5 reps;AROM;10 reps    ABduction  PROM;5 reps;AROM;10 reps    ABduction Limitations  --      Shoulder Exercises: Standing   Protraction  AROM;10 reps    Horizontal ABduction  AROM;10 reps    External Rotation  AROM;10 reps    Internal Rotation  AROM;10 reps    Flexion  AROM;10 reps    ABduction  AROM;10 reps    Extension  Theraband;10 reps    Theraband Level (Shoulder Extension)  Level 2 (Red)    Row  Theraband;10 reps    Theraband Level (Shoulder Row)  Level 2 (Red)    Retraction  Theraband;10 reps    Theraband Level (Shoulder Retraction)  Level 2 (Red)      Shoulder Exercises: ROM/Strengthening   Wall Wash  1'  Over Head Lace  1'    Proximal Shoulder Strengthening, Supine  12X A/ROM no rest breaks    Proximal Shoulder Strengthening, Seated  10X with no rest breaks    Other ROM/Strengthening Exercises  PVC pipe slide; 12X      Manual Therapy   Manual Therapy  Myofascial release    Manual therapy comments  Manual therapy completed prior to exercises.     Soft tissue mobilization  Myofascial release and manual stretching completed to left upper arm, trapezius, and scapularis region to decrease fascial restrictions and increase joint mobility in a pain free zone.               OT Short Term Goals - 06/21/18 1513      OT SHORT TERM GOAL #1   Title  Pt will be provided with and educated on HEP to improve ability to use LUE during ADL tasks.     Time  4    Period  Weeks    Status  On-going      OT SHORT TERM GOAL #2   Title  Pt will improve P/ROM of LUE to [facility] to improve ability to use LUE as assist during ADL completion.     Time  4    Period  Weeks    Status  On-going      OT SHORT TERM GOAL #3   Title  Pt will improve LUE strength to 3+/5 to improve ability to  reach for items above waist level.     Time  4    Period  Weeks    Status  On-going      OT SHORT TERM GOAL #4   Title  Pt will decrease pain in LUE to 4/10 to improve ability to sleep in his bed.     Time  4    Period  Weeks    Status  On-going        OT Long Term Goals - 06/21/18 1513      OT LONG TERM GOAL #1   Title  Pt will return to highest level of functioning using LUE as non-dominant during ADL and work tasks.     Time  8    Period  Weeks    Status  On-going      OT LONG TERM GOAL #2   Title  Pt will decrease pain in LUE to 2/10 or less to improve ability to using LUE as non-dominant during dressing tasks.     Time  8    Period  Weeks    Status  On-going      OT LONG TERM GOAL #3   Title  Pt will improve A/ROM of LUE to [facility] to improve ability to reach for items overhead and behind back.     Time  8    Period  Weeks    Status  On-going      OT LONG TERM GOAL #4   Title  Pt will increase strength in LUE to 4+/5 to improve ability to lift and carry weighted objects during ADL and work tasks.     Time  8    Period  Weeks    Status  On-going      OT LONG TERM GOAL #5   Title  Pt will decrease LUE fascial restrictions from mod to minimal amounts or less to improve mobility required for functional reaching tasks.     Time  8    Period  Weeks    Status  On-going            Plan - 07/12/18 1156    Clinical Impression Statement  A: Resumed myofascial release to address fascial restrictions palpated in trapezius and scapularis regions today. Continued with A/ROM working towards all A/ROM, pt only requiring AA/ROM to gain greater range with er. Added retraction with theraband as well as overhead lacing. Min fatigue noted at end of session. Verbal cuing for form and technique.     Plan  P: Mini-reassessment. Update HEP for scapular theraband. Add UBE, continue working towards all A/ROM       Patient will benefit from skilled therapeutic intervention in order to  improve the following deficits and impairments:  Decreased range of motion, Decreased activity tolerance, Increased fascial restrictions, Impaired UE functional use, Pain, Decreased mobility, Decreased strength  Visit Diagnosis: Acute pain of left shoulder  Stiffness of left shoulder, not elsewhere classified  Other symptoms and signs involving the musculoskeletal system    Problem List There are no active problems to display for this patient.  Guadelupe Sabin, OTR/L  [phone] 07/12/2018, 11:59 AM  [place] [address] [place], [place], [address] Phone: [phone]   Fax:  [phone]  Name: BRYCESON GRAPE MRN: [id number] Date of Birth: 05-24-1961

## 2018-07-17 ENCOUNTER — Encounter (HOSPITAL_COMMUNITY): Payer: Self-pay | Admitting: Occupational Therapy

## 2018-07-17 ENCOUNTER — Ambulatory Visit (HOSPITAL_COMMUNITY): Payer: BLUE CROSS/BLUE SHIELD | Admitting: Occupational Therapy

## 2018-07-17 DIAGNOSIS — M25612 Stiffness of left shoulder, not elsewhere classified: Secondary | ICD-10-CM

## 2018-07-17 DIAGNOSIS — M25512 Pain in left shoulder: Secondary | ICD-10-CM

## 2018-07-17 DIAGNOSIS — R29898 Other symptoms and signs involving the musculoskeletal system: Secondary | ICD-10-CM

## 2018-07-17 NOTE — Patient Instructions (Signed)

## 2018-07-17 NOTE — Therapy (Signed)
[place], [place], [address] Phone: [phone]   Fax:  [phone]  Occupational Therapy Treatment  Patient Details  Name: Benjamin Gill MRN: [id number] Date of Birth: [birth year] Referring Provider (OT): Dr. Becky Sax   Encounter Date: 07/17/2018  OT End of Session - 07/17/18 1118    Visit Number  9    Number of Visits  16    Date for OT Re-Evaluation  08/17/18    Authorization Type  BCBS    Authorization Time Period  no visit limit    OT Start Time  1033    OT Stop Time  1115    OT Time Calculation (min)  42 min    Activity Tolerance  Patient tolerated treatment well    Behavior During Therapy  [facility] for tasks assessed/performed       History reviewed. No pertinent past medical history.  History reviewed. No pertinent surgical history.  There were no vitals filed for this visit.  Subjective Assessment - 07/17/18 1031    Subjective   S: It's just hard to get comfortable at night.     Currently in Pain?  Yes    Pain Score  2     Pain Location  Shoulder    Pain Orientation  Left    Pain Descriptors / Indicators  Aching;Sore    Pain Type  Acute pain    Pain Radiating Towards  N/A    Pain Onset  In the past 7 days    Pain Frequency  Intermittent    Aggravating Factors   wrong movements, sleeping wrong    Pain Relieving Factors  movement, stretching    Effect of Pain on Daily Activities  moderate effect    Multiple Pain Sites  No         [facility] OT Assessment - 07/17/18 1031      Assessment   Medical Diagnosis  s/p closed displaced clavicle shaft fx      Precautions   Precautions  Shoulder    Type of Shoulder Precautions  Gentle progressive weightbearing allowed over the next 2 weeks. Per MD note. 07/10/18      Observation/Other Assessments   Focus on Therapeutic Outcomes (FOTO)   62/100   42/100 previous     AROM   Overall AROM Comments  Assessed seated, er/IR adducted    AROM Assessment  Site  Shoulder    Right/Left Shoulder  Left    Left Shoulder Flexion  145 Degrees   60 previous   Left Shoulder ABduction  154 Degrees   49 previous   Left Shoulder Internal Rotation  90 Degrees   same as previous   Left Shoulder External Rotation  56 Degrees   39 previous     PROM   Overall PROM Comments  Assessed supine, er/IR adducted    PROM Assessment Site  Shoulder    Right/Left Shoulder  Left    Left Shoulder Flexion  153 Degrees   104 previous   Left Shoulder ABduction  180 Degrees   64 previous   Left Shoulder Internal Rotation  90 Degrees   same as previous   Left Shoulder External Rotation  60 Degrees   35 previous     Strength   Overall Strength Comments  Assessed seated, er/IR adducted    Strength Assessment Site  Shoulder    Right/Left Shoulder  Left    Left Shoulder Flexion  4+/5   2+/5  previous   Left Shoulder ABduction  4/5   2+/5 previous   Left Shoulder Internal Rotation  4+/5   3/5 previous   Left Shoulder External Rotation  3/5   3-/5 previous              OT Treatments/Exercises (OP) - 07/17/18 1035      Exercises   Exercises  Shoulder      Shoulder Exercises: Supine   Protraction  PROM;5 reps;AROM;12 reps    Horizontal ABduction  PROM;5 reps;AROM;12 reps    External Rotation  PROM;5 reps;AROM;12 reps    Internal Rotation  PROM;5 reps;AROM;12 reps    Flexion  PROM;5 reps;AROM;12 reps    ABduction  PROM;5 reps;AROM;12 reps      Shoulder Exercises: Standing   Protraction  AROM;12 reps    Horizontal ABduction  AROM;12 reps    External Rotation  AROM;12 reps    Internal Rotation  AROM;12 reps    Flexion  AROM;12 reps    ABduction  AROM;12 reps    Extension  Theraband;10 reps    Theraband Level (Shoulder Extension)  Level 2 (Red)    Row  Theraband;10 reps    Theraband Level (Shoulder Row)  Level 2 (Red)    Retraction  Theraband;10 reps    Theraband Level (Shoulder Retraction)  Level 2 (Red)      Shoulder Exercises:  ROM/Strengthening   UBE (Upper Arm Bike)  Level 1 1' forward 1' reverse    Proximal Shoulder Strengthening, Supine  12X A/ROM no rest breaks    Proximal Shoulder Strengthening, Seated  10X with no rest breaks      Manual Therapy   Manual Therapy  Myofascial release    Manual therapy comments  Manual therapy completed prior to exercises.     Soft tissue mobilization  Myofascial release and manual stretching completed to left upper arm, trapezius, and scapularis region to decrease fascial restrictions and increase joint mobility in a pain free zone.             OT Education - 07/17/18 1104    Education Details  scapular theraband    Person(s) Educated  Patient    Methods  Explanation;Demonstration;Handout    Comprehension  Verbalized understanding;Returned demonstration       OT Short Term Goals - 07/17/18 1101      OT SHORT TERM GOAL #1   Title  Pt will be provided with and educated on HEP to improve ability to use LUE during ADL tasks.     Time  4    Period  Weeks    Status  On-going      OT SHORT TERM GOAL #2   Title  Pt will improve P/ROM of LUE to [facility] to improve ability to use LUE as assist during ADL completion.     Time  4    Period  Weeks    Status  Achieved      OT SHORT TERM GOAL #3   Title  Pt will improve LUE strength to 3+/5 to improve ability to reach for items above waist level.     Time  4    Period  Weeks    Status  Partially Met      OT SHORT TERM GOAL #4   Title  Pt will decrease pain in LUE to 4/10 to improve ability to sleep in his bed.     Time  4    Period  Weeks    Status  Achieved        OT Long Term Goals - 07/17/18 1101      OT LONG TERM GOAL #1   Title  Pt will return to highest level of functioning using LUE as non-dominant during ADL and work tasks.     Time  8    Period  Weeks    Status  On-going      OT LONG TERM GOAL #2   Title  Pt will decrease pain in LUE to 2/10 or less to improve ability to using LUE as non-dominant  during dressing tasks.     Time  8    Period  Weeks    Status  On-going      OT LONG TERM GOAL #3   Title  Pt will improve A/ROM of LUE to [facility] to improve ability to reach for items overhead and behind back.     Time  8    Period  Weeks    Status  Partially Met      OT LONG TERM GOAL #4   Title  Pt will increase strength in LUE to 4+/5 to improve ability to lift and carry weighted objects during ADL and work tasks.     Time  8    Period  Weeks    Status  On-going      OT LONG TERM GOAL #5   Title  Pt will decrease LUE fascial restrictions from mod to minimal amounts or less to improve mobility required for functional reaching tasks.     Time  8    Period  Weeks    Status  On-going            Plan - 07/17/18 1101    Clinical Impression Statement  A: Mini-reassessment completed this session, pt has met 2/4 STGs and has partially met one STG and one LTG. Pt is making good progress towards goals and improving functional use of LUE. Pt reports greater ease with daily tasks, continues to have difficulty with strength, activity tolerance, and discomfort-mainly when trying to sleep. Continued with A/ROM today progressing to 12 repetitions of each exercise, added UBE and updated HEP for scapular theraband. Verbal cuing for form and technique during session.     Plan  P: Follow up on HEP, add x to v arms and increase UBE to 2' each direction       Patient will benefit from skilled therapeutic intervention in order to improve the following deficits and impairments:  Decreased range of motion, Decreased activity tolerance, Increased fascial restrictions, Impaired UE functional use, Pain, Decreased mobility, Decreased strength  Visit Diagnosis: Acute pain of left shoulder  Stiffness of left shoulder, not elsewhere classified  Other symptoms and signs involving the musculoskeletal system    Problem List There are no active problems to display for this patient.  Guadelupe Sabin,  OTR/L  [phone] 07/17/2018, 11:20 AM  [place] [address] [place], [place], [address] Phone: [phone]   Fax:  [phone]  Name: Benjamin Gill MRN: [id number] Date of Birth: 02/20/1961

## 2018-07-19 ENCOUNTER — Encounter (HOSPITAL_COMMUNITY): Payer: Self-pay

## 2018-07-19 ENCOUNTER — Ambulatory Visit (HOSPITAL_COMMUNITY): Payer: BLUE CROSS/BLUE SHIELD

## 2018-07-19 DIAGNOSIS — R29898 Other symptoms and signs involving the musculoskeletal system: Secondary | ICD-10-CM

## 2018-07-19 DIAGNOSIS — M25512 Pain in left shoulder: Secondary | ICD-10-CM

## 2018-07-19 DIAGNOSIS — M25612 Stiffness of left shoulder, not elsewhere classified: Secondary | ICD-10-CM

## 2018-07-19 NOTE — Therapy (Signed)
[place], [place], [address] Phone: [phone]   Fax:  [phone]  Occupational Therapy Treatment  Patient Details  Name: Benjamin Gill MRN: [id number] Date of Birth: Jul 13, 1960 Referring Provider (OT): Dr. Becky Sax   Encounter Date: 07/19/2018  OT End of Session - 07/19/18 1109    Visit Number  10    Number of Visits  16    Date for OT Re-Evaluation  08/17/18    Authorization Type  BCBS    Authorization Time Period  no visit limit    OT Start Time  1033    OT Stop Time  1115    OT Time Calculation (min)  42 min    Activity Tolerance  Patient tolerated treatment well    Behavior During Therapy  [facility] for tasks assessed/performed       History reviewed. No pertinent past medical history.  History reviewed. No pertinent surgical history.  There were no vitals filed for this visit.  Subjective Assessment - 07/19/18 1046    Subjective   S: I may be sleeping on it wrong and it's making it sore.     Currently in Pain?  Yes    Pain Score  2     Pain Location  Shoulder    Pain Orientation  Left    Pain Descriptors / Indicators  Aching;Sore    Pain Type  Acute pain         [facility] OT Assessment - 07/19/18 1047      Assessment   Medical Diagnosis  s/p closed displaced clavicle shaft fx      Precautions   Precautions  Shoulder    Type of Shoulder Precautions  Gentle progressive weightbearing allowed over the next 2 weeks. Per MD note. 07/10/18               OT Treatments/Exercises (OP) - 07/19/18 1047      Exercises   Exercises  Shoulder      Shoulder Exercises: Supine   Protraction  PROM;5 reps;AROM;12 reps    Horizontal ABduction  PROM;5 reps;AROM;12 reps    External Rotation  PROM;5 reps;AROM;12 reps    Internal Rotation  PROM;5 reps;AROM;12 reps    Flexion  PROM;5 reps;AROM;12 reps    ABduction  PROM;5 reps;AROM;12 reps      Shoulder Exercises: Prone   Other Prone Exercises   Hughston exercises; 10X; A/ROM; H1, H3      Shoulder Exercises: Standing   Protraction  AROM;12 reps    Horizontal ABduction  AROM;12 reps    External Rotation  AROM;12 reps    Internal Rotation  AROM;12 reps    Flexion  AROM;12 reps    ABduction  AROM;12 reps    Extension  Theraband;12 reps    Theraband Level (Shoulder Extension)  Level 2 (Red)    Row  Theraband;12 reps    Theraband Level (Shoulder Row)  Level 2 (Red)    Retraction  Theraband;12 reps    Theraband Level (Shoulder Retraction)  Level 2 (Red)      Shoulder Exercises: ROM/Strengthening   UBE (Upper Arm Bike)  level 1 2' forward 2' reverse   pace: 4.5-5.0   X to V Arms  10X    Proximal Shoulder Strengthening, Seated  10X with no rest breaks      Manual Therapy   Manual Therapy  Myofascial release    Manual therapy comments  Manual therapy completed prior to exercises.  Soft tissue mobilization  Myofascial release and manual stretching completed to left upper arm, trapezius, and scapularis region to decrease fascial restrictions and increase joint mobility in a pain free zone.               OT Short Term Goals - 07/19/18 1111      OT SHORT TERM GOAL #1   Title  Pt will be provided with and educated on HEP to improve ability to use LUE during ADL tasks.     Time  4    Period  Weeks    Status  On-going      OT SHORT TERM GOAL #2   Title  Pt will improve P/ROM of LUE to [facility] to improve ability to use LUE as assist during ADL completion.     Time  4    Period  Weeks      OT SHORT TERM GOAL #3   Title  Pt will improve LUE strength to 3+/5 to improve ability to reach for items above waist level.     Time  4    Period  Weeks    Status  Partially Met      OT SHORT TERM GOAL #4   Title  Pt will decrease pain in LUE to 4/10 to improve ability to sleep in his bed.     Time  4    Period  Weeks        OT Long Term Goals - 07/17/18 1101      OT LONG TERM GOAL #1   Title  Pt will return to highest  level of functioning using LUE as non-dominant during ADL and work tasks.     Time  8    Period  Weeks    Status  On-going      OT LONG TERM GOAL #2   Title  Pt will decrease pain in LUE to 2/10 or less to improve ability to using LUE as non-dominant during dressing tasks.     Time  8    Period  Weeks    Status  On-going      OT LONG TERM GOAL #3   Title  Pt will improve A/ROM of LUE to [facility] to improve ability to reach for items overhead and behind back.     Time  8    Period  Weeks    Status  Partially Met      OT LONG TERM GOAL #4   Title  Pt will increase strength in LUE to 4+/5 to improve ability to lift and carry weighted objects during ADL and work tasks.     Time  8    Period  Weeks    Status  On-going      OT LONG TERM GOAL #5   Title  Pt will decrease LUE fascial restrictions from mod to minimal amounts or less to improve mobility required for functional reaching tasks.     Time  8    Period  Weeks    Status  On-going            Plan - 07/19/18 1109    Clinical Impression Statement  A: Added hughston exercises H1 and H2, x to v arms, and increased time for UBE bike to continue working on scapular and shoulder strength and stability. Pt presents with large muscle knot in left upper trapezius with manual techniques completed  to address.     Plan  P: Add ball on  the wall.     Consulted and Agree with Plan of Care  Patient       Patient will benefit from skilled therapeutic intervention in order to improve the following deficits and impairments:  Decreased range of motion, Decreased activity tolerance, Increased fascial restrictions, Impaired UE functional use, Pain, Decreased mobility, Decreased strength  Visit Diagnosis: Acute pain of left shoulder  Stiffness of left shoulder, not elsewhere classified  Other symptoms and signs involving the musculoskeletal system    Problem List There are no active problems to display for this patient.  Ailene Ravel, OTR/L,CBIS  [phone]  07/19/2018, 11:12 AM  [place], [place], [address] Phone: [phone]   Fax:  [phone]  Name: JUSTYCE BABY MRN: [id number] Date of Birth: 1961/04/28

## 2018-07-23 ENCOUNTER — Encounter (HOSPITAL_COMMUNITY): Payer: Self-pay | Admitting: Specialist

## 2018-07-23 ENCOUNTER — Ambulatory Visit (HOSPITAL_COMMUNITY): Payer: BLUE CROSS/BLUE SHIELD | Admitting: Specialist

## 2018-07-23 DIAGNOSIS — M25612 Stiffness of left shoulder, not elsewhere classified: Secondary | ICD-10-CM

## 2018-07-23 DIAGNOSIS — R29898 Other symptoms and signs involving the musculoskeletal system: Secondary | ICD-10-CM

## 2018-07-23 DIAGNOSIS — M25512 Pain in left shoulder: Secondary | ICD-10-CM | POA: Diagnosis not present

## 2018-07-23 NOTE — Therapy (Signed)
[place], [place], [address] Phone: [phone]   Fax:  [phone]  Occupational Therapy Treatment  Patient Details  Name: Benjamin Gill MRN: [id number] Date of Birth: [birth year] Referring Provider (OT): Dr. Becky Sax   Encounter Date: 07/23/2018  OT End of Session - 07/23/18 1529    Visit Number  11    Number of Visits  16    Date for OT Re-Evaluation  08/17/18    Authorization Type  BCBS    Authorization Time Period  no visit limit    OT Start Time  0905    OT Stop Time  0950    OT Time Calculation (min)  45 min    Activity Tolerance  Patient tolerated treatment well    Behavior During Therapy  [facility] for tasks assessed/performed       History reviewed. No pertinent past medical history.  History reviewed. No pertinent surgical history.  There were no vitals filed for this visit.  Subjective Assessment - 07/23/18 1528    Subjective   S:  I can tell it's there, definitely getting better though.     Currently in Pain?  Yes    Pain Score  2     Pain Location  Shoulder    Pain Orientation  Left         [facility] OT Assessment - 07/23/18 0001      Assessment   Medical Diagnosis  s/p closed displaced clavicle shaft fx      Precautions   Precautions  Shoulder    Type of Shoulder Precautions  Gentle progressive weightbearing allowed over the next 2 weeks. Per MD note. 07/10/18               OT Treatments/Exercises (OP) - 07/23/18 0001      Exercises   Exercises  Shoulder      Shoulder Exercises: Supine   Protraction  PROM;5 reps;AROM;15 reps    Horizontal ABduction  PROM;5 reps;AROM;15 reps    External Rotation  PROM;5 reps;AROM;15 reps    Internal Rotation  PROM;5 reps;AROM;15 reps    Flexion  5 reps;PROM;AROM;15 reps    ABduction  PROM;5 reps;AROM;15 reps      Shoulder Exercises: Standing   Protraction  AROM;12 reps    Horizontal ABduction  AROM;12 reps    External Rotation  AROM;12  reps;Theraband;10 reps    Theraband Level (Shoulder External Rotation)  Level 2 (Red)    Internal Rotation  AROM;12 reps;Theraband;10 reps    Theraband Level (Shoulder Internal Rotation)  Level 2 (Red)    Flexion  AROM;12 reps    ABduction  AROM;12 reps    Extension  Theraband;12 reps    Theraband Level (Shoulder Extension)  Level 2 (Red)    Row  Theraband;12 reps    Theraband Level (Shoulder Row)  Level 2 (Red)    Retraction  Theraband;12 reps    Theraband Level (Shoulder Retraction)  Level 2 (Red)      Shoulder Exercises: Therapy Ball   Right/Left  5 reps      Shoulder Exercises: ROM/Strengthening   Proximal Shoulder Strengthening, Supine  12X A/ROM no rest breaks    Proximal Shoulder Strengthening, Seated  10X with no rest breaks    Ball on Wall  1' flexed and 1' abducted with moderate difficulty and verbal guidance for technique, abduction more difficult than flexion    Other ROM/Strengthening Exercises  therapist held small ball behind patients  back and patient reached for ball into varying degrees of internal rotation       Manual Therapy   Manual Therapy  Myofascial release    Manual therapy comments  Manual therapy completed prior to exercises.     Soft tissue mobilization  Myofascial release and manual stretching completed to left upper arm, trapezius, and scapularis region to decrease fascial restrictions and increase joint mobility in a pain free zone.               OT Short Term Goals - 07/19/18 1111      OT SHORT TERM GOAL #1   Title  Pt will be provided with and educated on HEP to improve ability to use LUE during ADL tasks.     Time  4    Period  Weeks    Status  On-going      OT SHORT TERM GOAL #2   Title  Pt will improve P/ROM of LUE to [facility] to improve ability to use LUE as assist during ADL completion.     Time  4    Period  Weeks      OT SHORT TERM GOAL #3   Title  Pt will improve LUE strength to 3+/5 to improve ability to reach for items above  waist level.     Time  4    Period  Weeks    Status  Partially Met      OT SHORT TERM GOAL #4   Title  Pt will decrease pain in LUE to 4/10 to improve ability to sleep in his bed.     Time  4    Period  Weeks        OT Long Term Goals - 07/17/18 1101      OT LONG TERM GOAL #1   Title  Pt will return to highest level of functioning using LUE as non-dominant during ADL and work tasks.     Time  8    Period  Weeks    Status  On-going      OT LONG TERM GOAL #2   Title  Pt will decrease pain in LUE to 2/10 or less to improve ability to using LUE as non-dominant during dressing tasks.     Time  8    Period  Weeks    Status  On-going      OT LONG TERM GOAL #3   Title  Pt will improve A/ROM of LUE to [facility] to improve ability to reach for items overhead and behind back.     Time  8    Period  Weeks    Status  Partially Met      OT LONG TERM GOAL #4   Title  Pt will increase strength in LUE to 4+/5 to improve ability to lift and carry weighted objects during ADL and work tasks.     Time  8    Period  Weeks    Status  On-going      OT LONG TERM GOAL #5   Title  Pt will decrease LUE fascial restrictions from mod to minimal amounts or less to improve mobility required for functional reaching tasks.     Time  8    Period  Weeks    Status  On-going            Plan - 07/23/18 1529    Clinical Impression Statement  A:  Patient with significant fascial restriction in scapular region  along supraspinatus muscle and upper trapezius.  Good vasomotor response with manual therapy technque.  Patient able to complete ball on wall exercise this date with mod difficulty.  Internal rotation lacking causing difficulty donning belts and scratching back, added ball grab exercise to address.     Plan  P:  Continue manual therapy to decrease fascial restrictions that are causing restricted motion.  Complete ball on wall with les facilitation and focus on improving functional internal rotation.         Patient will benefit from skilled therapeutic intervention in order to improve the following deficits and impairments:  Decreased range of motion, Decreased activity tolerance, Increased fascial restrictions, Impaired UE functional use, Pain, Decreased mobility, Decreased strength  Visit Diagnosis: Acute pain of left shoulder  Stiffness of left shoulder, not elsewhere classified  Other symptoms and signs involving the musculoskeletal system    Problem List There are no active problems to display for this patient.   Vangie Bicker, [place], OTR/L [phone]  07/23/2018, 3:37 PM  [place] [address] [place], [place], [address] Phone: [phone]   Fax:  [phone]  Name: Benjamin Gill MRN: [id number] Date of Birth: [birth year]

## 2018-07-25 ENCOUNTER — Encounter (HOSPITAL_COMMUNITY): Payer: Self-pay | Admitting: Occupational Therapy

## 2018-07-25 ENCOUNTER — Ambulatory Visit (HOSPITAL_COMMUNITY): Payer: BLUE CROSS/BLUE SHIELD | Admitting: Occupational Therapy

## 2018-07-25 DIAGNOSIS — M25512 Pain in left shoulder: Secondary | ICD-10-CM

## 2018-07-25 DIAGNOSIS — R29898 Other symptoms and signs involving the musculoskeletal system: Secondary | ICD-10-CM

## 2018-07-25 DIAGNOSIS — M25612 Stiffness of left shoulder, not elsewhere classified: Secondary | ICD-10-CM

## 2018-07-25 NOTE — Therapy (Signed)
[place], [place], [address] Phone: [phone]   Fax:  [phone]  Occupational Therapy Treatment  Patient Details  Name: Benjamin Gill MRN: [id number] Date of Birth: [birth year] Referring Provider (OT): Dr. Becky Sax   Encounter Date: 07/25/2018  OT End of Session - 07/25/18 1030    Visit Number  12    Number of Visits  16    Date for OT Re-Evaluation  08/17/18    Authorization Type  BCBS    Authorization Time Period  no visit limit    OT Start Time  0948    OT Stop Time  1030    OT Time Calculation (min)  42 min    Activity Tolerance  Patient tolerated treatment well    Behavior During Therapy  [facility] for tasks assessed/performed       History reviewed. No pertinent past medical history.  History reviewed. No pertinent surgical history.  There were no vitals filed for this visit.  Subjective Assessment - 07/25/18 0946    Subjective   S: It will start hurting when I sit down after using it.     Currently in Pain?  No/denies         [facility] OT Assessment - 07/25/18 0946      Assessment   Medical Diagnosis  s/p closed displaced clavicle shaft fx      Precautions   Precautions  Shoulder    Type of Shoulder Precautions  Gentle progressive weightbearing allowed over the next 2 weeks, no lifting over 10#. Per MD note. 07/10/18               OT Treatments/Exercises (OP) - 07/25/18 0946      Exercises   Exercises  Shoulder      Shoulder Exercises: Supine   Protraction  PROM;5 reps;Strengthening;12 reps    Protraction Weight (lbs)  1    Horizontal ABduction  PROM;5 reps;Strengthening;12 reps    Horizontal ABduction Weight (lbs)  1    External Rotation  PROM;5 reps;Strengthening;12 reps    External Rotation Weight (lbs)  1    Internal Rotation  PROM;5 reps;Strengthening;12 reps    Internal Rotation Weight (lbs)  1    Flexion  PROM;5 reps;Strengthening;12 reps    Shoulder Flexion Weight  (lbs)  1    ABduction  PROM;5 reps;Strengthening;12 reps    Shoulder ABduction Weight (lbs)  1      Shoulder Exercises: Standing   Protraction  Strengthening;12 reps    Protraction Weight (lbs)  1    Horizontal ABduction  Strengthening;12 reps    Horizontal ABduction Weight (lbs)  1    External Rotation  Strengthening;12 reps    External Rotation Weight (lbs)  1    Internal Rotation  Strengthening;12 reps    Internal Rotation Weight (lbs)  1    Flexion  Strengthening;12 reps    Shoulder Flexion Weight (lbs)  1    ABduction  Strengthening;12 reps    Shoulder ABduction Weight (lbs)  1    Extension  Theraband;15 reps    Theraband Level (Shoulder Extension)  Level 2 (Red)    Row  Theraband;15 reps    Theraband Level (Shoulder Row)  Level 2 (Red)    Retraction  Theraband;15 reps    Theraband Level (Shoulder Retraction)  Level 2 (Red)      Shoulder Exercises: ROM/Strengthening   X to V Arms  12X    Proximal Shoulder Strengthening, Supine  12X, 1# no rest breaks    Proximal Shoulder Strengthening, Seated  15X with no rest breaks    Ball on Wall  1' flexion 1' abduction    Other ROM/Strengthening Exercises  Pt passed tennis ball behind back working on LUE IR, 10X A/ROM      Manual Therapy   Manual Therapy  Myofascial release;Muscle Energy Technique    Manual therapy comments  Manual therapy completed prior to exercises.     Soft tissue mobilization  Myofascial release and manual stretching completed to left upper arm, trapezius, and scapularis region to decrease fascial restrictions and increase joint mobility in a pain free zone.    Muscle Energy Technique  Muscle energy technique to LUE flexors and external rotators to decrease muscle tightness and improve ROM               OT Short Term Goals - 07/19/18 1111      OT SHORT TERM GOAL #1   Title  Pt will be provided with and educated on HEP to improve ability to use LUE during ADL tasks.     Time  4    Period  Weeks     Status  On-going      OT SHORT TERM GOAL #2   Title  Pt will improve P/ROM of LUE to [facility] to improve ability to use LUE as assist during ADL completion.     Time  4    Period  Weeks      OT SHORT TERM GOAL #3   Title  Pt will improve LUE strength to 3+/5 to improve ability to reach for items above waist level.     Time  4    Period  Weeks    Status  Partially Met      OT SHORT TERM GOAL #4   Title  Pt will decrease pain in LUE to 4/10 to improve ability to sleep in his bed.     Time  4    Period  Weeks        OT Long Term Goals - 07/17/18 1101      OT LONG TERM GOAL #1   Title  Pt will return to highest level of functioning using LUE as non-dominant during ADL and work tasks.     Time  8    Period  Weeks    Status  On-going      OT LONG TERM GOAL #2   Title  Pt will decrease pain in LUE to 2/10 or less to improve ability to using LUE as non-dominant during dressing tasks.     Time  8    Period  Weeks    Status  On-going      OT LONG TERM GOAL #3   Title  Pt will improve A/ROM of LUE to [facility] to improve ability to reach for items overhead and behind back.     Time  8    Period  Weeks    Status  Partially Met      OT LONG TERM GOAL #4   Title  Pt will increase strength in LUE to 4+/5 to improve ability to lift and carry weighted objects during ADL and work tasks.     Time  8    Period  Weeks    Status  On-going      OT LONG TERM GOAL #5   Title  Pt will decrease LUE fascial restrictions from mod to minimal amounts or  less to improve mobility required for functional reaching tasks.     Time  8    Period  Weeks    Status  On-going            Plan - 07/25/18 1006    Clinical Impression Statement  A: Continued with manual therapy to address fascial restrictions in trapezius and scapular regions, added muscle energy technique to flexors and external rotators with good response-pt reaching passive er [facility]. Progressed to shoulder strengthening this session using 1#  weight, pt completing with min difficulty and fatigue. Continued with IR, pt with good form and reach during ball pass, also improved form with ball on wall. Verbal cuing for form and technique.     Plan  P: Complete IR/er with shoulder abducted. Continue with muscle energy technique to improve ROM. Add shoulder stretches and update HEP       Patient will benefit from skilled therapeutic intervention in order to improve the following deficits and impairments:  Decreased range of motion, Decreased activity tolerance, Increased fascial restrictions, Impaired UE functional use, Pain, Decreased mobility, Decreased strength  Visit Diagnosis: Acute pain of left shoulder  Stiffness of left shoulder, not elsewhere classified  Other symptoms and signs involving the musculoskeletal system    Problem List There are no active problems to display for this patient.  Guadelupe Sabin, OTR/L  [phone] 07/25/2018, 10:32 AM  [place] [address] [place], [place], [address] Phone: [phone]   Fax:  [phone]  Name: Benjamin Gill MRN: [id number] Date of Birth: September 08, 1960

## 2018-07-30 ENCOUNTER — Encounter (HOSPITAL_COMMUNITY): Payer: Self-pay

## 2018-07-30 ENCOUNTER — Ambulatory Visit (HOSPITAL_COMMUNITY): Payer: BLUE CROSS/BLUE SHIELD

## 2018-07-30 DIAGNOSIS — M25512 Pain in left shoulder: Secondary | ICD-10-CM | POA: Diagnosis not present

## 2018-07-30 DIAGNOSIS — R29898 Other symptoms and signs involving the musculoskeletal system: Secondary | ICD-10-CM

## 2018-07-30 DIAGNOSIS — M25612 Stiffness of left shoulder, not elsewhere classified: Secondary | ICD-10-CM

## 2018-07-30 NOTE — Patient Instructions (Signed)
Complete the following exercises 2 times a day.  Doorway Stretch  Place each hand opposite each other on the doorway. (You can change where you feel the stretch by moving arms higher or lower.) Step through with one foot and bend front knee until a stretch is felt and hold. Step through with the opposite foot on the next rep. Hold for __15-30___ seconds. Repeat __2__times.      Towel Internal Rotation Stretch  Hold a towel in both hands. Bring one hand behind your body and with the other, reach behind your neck and use the towel to gently pull the other hand behind the back until you feel a stretch in the shoulder.   Hold for 15-30 seconds. Complete 2 times.     Wall Flexion  Slide your arm up the wall or door frame until a stretch is felt in your shoulder . Hold for 15-30 seconds. Complete 2 times     Shoulder Abduction Stretch  Stand side ways by a wall with affected up on wall. Gently step in toward wall to feel stretch. Hold for 15-30 seconds. Complete 2 times.

## 2018-07-30 NOTE — Therapy (Signed)
[place], [place], [address] Phone: [phone]   Fax:  [phone]  Occupational Therapy Treatment  Patient Details  Name: Benjamin Gill MRN: [id number] Date of Birth: December 20, 1960 Referring Provider (OT): Dr. Becky Sax   Encounter Date: 07/30/2018  OT End of Session - 07/30/18 1030    Visit Number  13    Number of Visits  16    Date for OT Re-Evaluation  08/17/18    Authorization Type  BCBS    Authorization Time Period  no visit limit    OT Start Time  0947    OT Stop Time  1030    OT Time Calculation (min)  43 min    Activity Tolerance  Patient tolerated treatment well    Behavior During Therapy  [facility] for tasks assessed/performed       History reviewed. No pertinent past medical history.  History reviewed. No pertinent surgical history.  There were no vitals filed for this visit.  Subjective Assessment - 07/30/18 1013    Subjective   S: I only have pain occassionally.    Currently in Pain?  No/denies         [facility] OT Assessment - 07/30/18 1013      Assessment   Medical Diagnosis  s/p closed displaced clavicle shaft fx      Precautions   Precautions  Shoulder    Type of Shoulder Precautions  Gentle progressive weightbearing allowed over the next 2 weeks, no lifting over 10#. Per MD note. 07/10/18               OT Treatments/Exercises (OP) - 07/30/18 1013      Exercises   Exercises  Shoulder      Shoulder Exercises: Supine   Protraction  PROM;5 reps;Strengthening;15 reps    Protraction Weight (lbs)  2    Horizontal ABduction  PROM;5 reps;Strengthening;15 reps    Horizontal ABduction Weight (lbs)  2    External Rotation  PROM;5 reps;Strengthening;15 reps    External Rotation Weight (lbs)  2    Internal Rotation  PROM;5 reps;Strengthening;15 reps    Internal Rotation Weight (lbs)  2    Flexion  PROM;5 reps;Strengthening;15 reps    Shoulder Flexion Weight (lbs)  2    ABduction   PROM;5 reps;Strengthening;15 reps    Shoulder ABduction Weight (lbs)  2      Shoulder Exercises: Standing   Protraction  Strengthening;10 reps    Protraction Weight (lbs)  2    Horizontal ABduction  Strengthening;10 reps    Horizontal ABduction Weight (lbs)  2    External Rotation  Strengthening;10 reps    External Rotation Weight (lbs)  2    Internal Rotation  Strengthening;10 reps    Internal Rotation Weight (lbs)  2    Flexion  Strengthening;10 reps    Shoulder Flexion Weight (lbs)  2    ABduction  Strengthening;10 reps    Shoulder ABduction Weight (lbs)  2      Shoulder Exercises: ROM/Strengthening   UBE (Upper Arm Bike)  level 2 2' forward 2' reverse   pace: 4.5-5.0   Proximal Shoulder Strengthening, Supine  15X with 2#; no rest breaks    Proximal Shoulder Strengthening, Seated  10X with 2#; no rest breaks      Shoulder Exercises: Stretch   Internal Rotation Stretch  2 reps   15 seconds; towel vertical   Wall Stretch - Flexion  2 reps  15 seconds   Wall Stretch - ABduction  2 reps   15 seconds   Other Shoulder Stretches  doorway stretch; 2 sets; 15 seconds      Manual Therapy   Manual Therapy  Myofascial release;Muscle Energy Technique    Manual therapy comments  Manual therapy completed prior to exercises.     Soft tissue mobilization  Myofascial release and manual stretching completed to left upper arm, trapezius, and scapularis region to decrease fascial restrictions and increase joint mobility in a pain free zone.    Muscle Energy Technique  Muscle energy technique to LUE flexors and external rotators to decrease muscle tightness and improve ROM             OT Education - 07/30/18 1029    Education Details  shoulder stretches    Person(s) Educated  Patient    Methods  Explanation;Demonstration;Handout;Verbal cues    Comprehension  Verbalized understanding;Returned demonstration       OT Short Term Goals - 07/19/18 1111      OT SHORT TERM GOAL #1    Title  Pt will be provided with and educated on HEP to improve ability to use LUE during ADL tasks.     Time  4    Period  Weeks    Status  On-going      OT SHORT TERM GOAL #2   Title  Pt will improve P/ROM of LUE to [facility] to improve ability to use LUE as assist during ADL completion.     Time  4    Period  Weeks      OT SHORT TERM GOAL #3   Title  Pt will improve LUE strength to 3+/5 to improve ability to reach for items above waist level.     Time  4    Period  Weeks    Status  Partially Met      OT SHORT TERM GOAL #4   Title  Pt will decrease pain in LUE to 4/10 to improve ability to sleep in his bed.     Time  4    Period  Weeks        OT Long Term Goals - 07/17/18 1101      OT LONG TERM GOAL #1   Title  Pt will return to highest level of functioning using LUE as non-dominant during ADL and work tasks.     Time  8    Period  Weeks    Status  On-going      OT LONG TERM GOAL #2   Title  Pt will decrease pain in LUE to 2/10 or less to improve ability to using LUE as non-dominant during dressing tasks.     Time  8    Period  Weeks    Status  On-going      OT LONG TERM GOAL #3   Title  Pt will improve A/ROM of LUE to [facility] to improve ability to reach for items overhead and behind back.     Time  8    Period  Weeks    Status  Partially Met      OT LONG TERM GOAL #4   Title  Pt will increase strength in LUE to 4+/5 to improve ability to lift and carry weighted objects during ADL and work tasks.     Time  8    Period  Weeks    Status  On-going      OT LONG TERM  GOAL #5   Title  Pt will decrease LUE fascial restrictions from mod to minimal amounts or less to improve mobility required for functional reaching tasks.     Time  8    Period  Weeks    Status  On-going            Plan - 07/30/18 1059    Clinical Impression Statement  A: Patient completed shoulder stretches to continue working on increasing ROM of LUE. HEP was updated and reviewed. VC for form and  technique were provided. Progressed to 2# weight as patient reports the 1# weight felt easy.      Plan  P: Follow up on shoulder stretches. Continue with strengthening with the 2# weight. Add over head lacing.        Patient will benefit from skilled therapeutic intervention in order to improve the following deficits and impairments:  Decreased range of motion, Decreased activity tolerance, Increased fascial restrictions, Impaired UE functional use, Pain, Decreased mobility, Decreased strength  Visit Diagnosis: Other symptoms and signs involving the musculoskeletal system  Acute pain of left shoulder  Stiffness of left shoulder, not elsewhere classified    Problem List There are no active problems to display for this patient.  Ailene Ravel, OTR/L,CBIS  [phone]  07/30/2018, 11:02 AM  [place], [place], [address] Phone: [phone]   Fax:  [phone]  Name: OLUWATIMILEYIN VIVIER MRN: [id number] Date of Birth: 06/01/1961

## 2018-08-01 ENCOUNTER — Ambulatory Visit (HOSPITAL_COMMUNITY): Payer: BLUE CROSS/BLUE SHIELD | Admitting: Occupational Therapy

## 2018-08-01 ENCOUNTER — Encounter (HOSPITAL_COMMUNITY): Payer: Self-pay | Admitting: Occupational Therapy

## 2018-08-01 DIAGNOSIS — M25612 Stiffness of left shoulder, not elsewhere classified: Secondary | ICD-10-CM

## 2018-08-01 DIAGNOSIS — M25512 Pain in left shoulder: Secondary | ICD-10-CM | POA: Diagnosis not present

## 2018-08-01 DIAGNOSIS — R29898 Other symptoms and signs involving the musculoskeletal system: Secondary | ICD-10-CM

## 2018-08-01 NOTE — Therapy (Signed)
[place], [place], [address] Phone: [phone]   Fax:  [phone]  Occupational Therapy Treatment  Patient Details  Name: Benjamin Gill MRN: [id number] Date of Birth: 1961-03-16 Referring Provider (OT): Dr. Becky Sax   Encounter Date: 08/01/2018  OT End of Session - 08/01/18 1114    Visit Number  14    Number of Visits  16    Date for OT Re-Evaluation  08/17/18    Authorization Type  BCBS    Authorization Time Period  no visit limit    OT Start Time  1032    OT Stop Time  1113    OT Time Calculation (min)  41 min    Activity Tolerance  Patient tolerated treatment well    Behavior During Therapy  [facility] for tasks assessed/performed       History reviewed. No pertinent past medical history.  History reviewed. No pertinent surgical history.  There were no vitals filed for this visit.  Subjective Assessment - 08/01/18 1030    Subjective   S: It was rough the other day but I did it.     Currently in Pain?  Yes    Pain Score  2     Pain Location  Shoulder    Pain Orientation  Left    Pain Descriptors / Indicators  Aching;Sore    Pain Type  Acute pain    Pain Radiating Towards  N/A    Pain Onset  In the past 7 days    Pain Frequency  Intermittent    Aggravating Factors   increased weight    Pain Relieving Factors  rest, stretching    Effect of Pain on Daily Activities  mod effect         [facility] OT Assessment - 08/01/18 1030      Assessment   Medical Diagnosis  s/p closed displaced clavicle shaft fx      Precautions   Precautions  Shoulder    Type of Shoulder Precautions  Gentle progressive weightbearing allowed over the next 2 weeks, no lifting over 10#. Per MD note. 07/10/18               OT Treatments/Exercises (OP) - 08/01/18 1034      Exercises   Exercises  Shoulder      Shoulder Exercises: Supine   Protraction  PROM;5 reps;Strengthening;15 reps    Protraction Weight (lbs)  2     Horizontal ABduction  PROM;5 reps;Strengthening;15 reps    Horizontal ABduction Weight (lbs)  2    External Rotation  PROM;5 reps;Strengthening;15 reps    External Rotation Weight (lbs)  2    Internal Rotation  PROM;5 reps;Strengthening;15 reps    Internal Rotation Weight (lbs)  2    Flexion  PROM;5 reps;Strengthening;15 reps    Shoulder Flexion Weight (lbs)  2    ABduction  PROM;5 reps;Strengthening;15 reps    Shoulder ABduction Weight (lbs)  2      Shoulder Exercises: Standing   Protraction  Strengthening;10 reps    Protraction Weight (lbs)  2    Horizontal ABduction  Strengthening;10 reps    Horizontal ABduction Weight (lbs)  2    External Rotation  Strengthening;10 reps    External Rotation Weight (lbs)  2    Internal Rotation  Strengthening;10 reps    Internal Rotation Weight (lbs)  2    Flexion  Strengthening;10 reps    Shoulder Flexion Weight (lbs)  2    ABduction  Strengthening;10 reps    Shoulder ABduction Weight (lbs)  2    Extension  Theraband;15 reps    Theraband Level (Shoulder Extension)  Level 3 (Green)    Row  [employer] reps    Theraband Level (Shoulder Row)  Level 3 (Green)    Retraction  Theraband;15 reps    Theraband Level (Shoulder Retraction)  Level 3 (Green)      Shoulder Exercises: Therapy Ball   Other Therapy Ball Exercises  green therapy ball: chest press, flexion, circles each direction, 10X each      Shoulder Exercises: ROM/Strengthening   Over Head Lace  1' standing    X to V Arms  10X, 2#    Proximal Shoulder Strengthening, Supine  15X with 2#; no rest breaks    Proximal Shoulder Strengthening, Seated  10X with 2#; no rest breaks    Ball on Wall  1' flexion 1' abduction      Shoulder Exercises: Stretch   Internal Rotation Stretch  2 reps   15" towel vertical     Manual Therapy   Manual Therapy  Myofascial release;Muscle Energy Technique    Manual therapy comments  Manual therapy completed prior to exercises.     Soft tissue  mobilization  Myofascial release and manual stretching completed to left upper arm, trapezius, and scapularis region to decrease fascial restrictions and increase joint mobility in a pain free zone.    Muscle Energy Technique  Muscle energy technique to LUE flexors and external rotators to decrease muscle tightness and improve ROM               OT Short Term Goals - 07/19/18 1111      OT SHORT TERM GOAL #1   Title  Pt will be provided with and educated on HEP to improve ability to use LUE during ADL tasks.     Time  4    Period  Weeks    Status  On-going      OT SHORT TERM GOAL #2   Title  Pt will improve P/ROM of LUE to [facility] to improve ability to use LUE as assist during ADL completion.     Time  4    Period  Weeks      OT SHORT TERM GOAL #3   Title  Pt will improve LUE strength to 3+/5 to improve ability to reach for items above waist level.     Time  4    Period  Weeks    Status  Partially Met      OT SHORT TERM GOAL #4   Title  Pt will decrease pain in LUE to 4/10 to improve ability to sleep in his bed.     Time  4    Period  Weeks        OT Long Term Goals - 07/17/18 1101      OT LONG TERM GOAL #1   Title  Pt will return to highest level of functioning using LUE as non-dominant during ADL and work tasks.     Time  8    Period  Weeks    Status  On-going      OT LONG TERM GOAL #2   Title  Pt will decrease pain in LUE to 2/10 or less to improve ability to using LUE as non-dominant during dressing tasks.     Time  8    Period  Weeks    Status  On-going      OT LONG TERM GOAL #3   Title  Pt will improve A/ROM of LUE to [facility] to improve ability to reach for items overhead and behind back.     Time  8    Period  Weeks    Status  Partially Met      OT LONG TERM GOAL #4   Title  Pt will increase strength in LUE to 4+/5 to improve ability to lift and carry weighted objects during ADL and work tasks.     Time  8    Period  Weeks    Status  On-going      OT  LONG TERM GOAL #5   Title  Pt will decrease LUE fascial restrictions from mod to minimal amounts or less to improve mobility required for functional reaching tasks.     Time  8    Period  Weeks    Status  On-going            Plan - 08/01/18 1056    Clinical Impression Statement  A: Pt reports slight soreness today. Continued with manual therapy to address increased fascial restrictions along upper arm and trapezius regions. Continued with 2# weights today, occasional rest breaks provided for fatigue. Added green therapy ball exercises and overhead lacing, progressed to green scapular theraband. Verbal cuing for form and technique, min/mod fatigue at end of session.     Plan  P: continue with shoulder and scapular strengthening, add diagonals with green therapy ball and increase overhead lacing ot 2'        Patient will benefit from skilled therapeutic intervention in order to improve the following deficits and impairments:  Decreased range of motion, Decreased activity tolerance, Increased fascial restrictions, Impaired UE functional use, Pain, Decreased mobility, Decreased strength  Visit Diagnosis: Other symptoms and signs involving the musculoskeletal system  Acute pain of left shoulder  Stiffness of left shoulder, not elsewhere classified    Problem List There are no active problems to display for this patient.  Guadelupe Sabin, OTR/L  [phone] 08/01/2018, 11:15 AM  [place] [address] [place], [place], [address] Phone: [phone]   Fax:  [phone]  Name: Benjamin Gill MRN: [id number] Date of Birth: May 31, 1961

## 2018-08-06 ENCOUNTER — Encounter (HOSPITAL_COMMUNITY): Payer: Self-pay

## 2018-08-06 ENCOUNTER — Ambulatory Visit (HOSPITAL_COMMUNITY): Payer: BLUE CROSS/BLUE SHIELD | Attending: Sports Medicine

## 2018-08-06 DIAGNOSIS — R29898 Other symptoms and signs involving the musculoskeletal system: Secondary | ICD-10-CM | POA: Diagnosis not present

## 2018-08-06 DIAGNOSIS — M25512 Pain in left shoulder: Secondary | ICD-10-CM | POA: Diagnosis not present

## 2018-08-06 DIAGNOSIS — M25612 Stiffness of left shoulder, not elsewhere classified: Secondary | ICD-10-CM

## 2018-08-06 NOTE — Therapy (Signed)
[place], [place], [address] Phone: [phone]   Fax:  [phone]  Occupational Therapy Treatment  Patient Details  Name: Benjamin Gill MRN: [id number] Date of Birth: 23-Feb-1961 Referring Provider (OT): Dr. Becky Sax   Encounter Date: 08/06/2018  OT End of Session - 08/06/18 1012    Visit Number  15    Number of Visits  16    Date for OT Re-Evaluation  08/17/18    Authorization Type  BCBS    Authorization Time Period  no visit limit    OT Start Time  0949    OT Stop Time  1030    OT Time Calculation (min)  41 min    Activity Tolerance  Patient tolerated treatment well    Behavior During Therapy  [facility] for tasks assessed/performed       History reviewed. No pertinent past medical history.  History reviewed. No pertinent surgical history.  There were no vitals filed for this visit.  Subjective Assessment - 08/06/18 1005    Subjective   S: I feel like something moves occassionally in my shoulder. It doesn't happen all the time but randomly certain movements I'll feel it.     Currently in Pain?  No/denies         [facility] OT Assessment - 08/06/18 1007      Assessment   Medical Diagnosis  s/p closed displaced clavicle shaft fx      Precautions   Precautions  Shoulder    Type of Shoulder Precautions  Gentle progressive weightbearing allowed over the next 2 weeks, no lifting over 10#. Per MD note. 07/10/18               OT Treatments/Exercises (OP) - 08/06/18 1007      Exercises   Exercises  Shoulder      Shoulder Exercises: Supine   Protraction  PROM;5 reps    Horizontal ABduction  PROM;5 reps    External Rotation  PROM;5 reps    Internal Rotation  PROM;5 reps    Flexion  PROM;5 reps    ABduction  PROM;5 reps      Shoulder Exercises: Sidelying   External Rotation  Strengthening;12 reps    External Rotation Weight (lbs)  2    Internal Rotation  Strengthening;12 reps    Internal  Rotation Weight (lbs)  2    Flexion  Strengthening;12 reps    Flexion Weight (lbs)  2    ABduction  Strengthening;12 reps    ABduction Weight (lbs)  2    Other Sidelying Exercises  Protraction; 12X; 2#    Other Sidelying Exercises  Horizontal abduction; 12X; 2#      Shoulder Exercises: Standing   Flexion  Strengthening;10 reps    Shoulder Flexion Weight (lbs)  2      Shoulder Exercises: Therapy Ball   Other Therapy Ball Exercises  green therapy ball: diagonals, chest press, flexion, circles each direction, 10X each      Shoulder Exercises: ROM/Strengthening   [employer] Press  --   20#; 10X   Cybex Row  --   20#' 10X   Over Head Lace  2' seated at [employer]craft pulleys    Other ROM/Strengthening Exercises  [employer] pulleys: Horizontal abduction; 10X; 10#      Manual Therapy   Manual Therapy  Myofascial release    Manual therapy comments  Manual therapy completed prior to exercises.     Soft tissue mobilization  Myofascial release and manual stretching completed to left upper arm, trapezius, and scapularis region to decrease fascial restrictions and increase joint mobility in a pain free zone.               OT Short Term Goals - 07/19/18 1111      OT SHORT TERM GOAL #1   Title  Pt will be provided with and educated on HEP to improve ability to use LUE during ADL tasks.     Time  4    Period  Weeks    Status  On-going      OT SHORT TERM GOAL #2   Title  Pt will improve P/ROM of LUE to [facility] to improve ability to use LUE as assist during ADL completion.     Time  4    Period  Weeks      OT SHORT TERM GOAL #3   Title  Pt will improve LUE strength to 3+/5 to improve ability to reach for items above waist level.     Time  4    Period  Weeks    Status  Partially Met      OT SHORT TERM GOAL #4   Title  Pt will decrease pain in LUE to 4/10 to improve ability to sleep in his bed.     Time  4    Period  Weeks        OT Long Term Goals - 07/17/18 1101      OT LONG TERM  GOAL #1   Title  Pt will return to highest level of functioning using LUE as non-dominant during ADL and work tasks.     Time  8    Period  Weeks    Status  On-going      OT LONG TERM GOAL #2   Title  Pt will decrease pain in LUE to 2/10 or less to improve ability to using LUE as non-dominant during dressing tasks.     Time  8    Period  Weeks    Status  On-going      OT LONG TERM GOAL #3   Title  Pt will improve A/ROM of LUE to [facility] to improve ability to reach for items overhead and behind back.     Time  8    Period  Weeks    Status  Partially Met      OT LONG TERM GOAL #4   Title  Pt will increase strength in LUE to 4+/5 to improve ability to lift and carry weighted objects during ADL and work tasks.     Time  8    Period  Weeks    Status  On-going      OT LONG TERM GOAL #5   Title  Pt will decrease LUE fascial restrictions from mod to minimal amounts or less to improve mobility required for functional reaching tasks.     Time  8    Period  Weeks    Status  On-going            Plan - 08/06/18 1012    Clinical Impression Statement  A: Added scapular strengthening sidelying with 2# weights, row and press with bodycrafy, increased time with over lacing. VC for form and technique. Patient did voice occassionally muscle fatigue although was able to complete all tasks.     Plan  P: Reassess and measure for MD appointment. Disacharge if approprate with an updated.  Consulted and Agree with Plan of Care  Patient       Patient will benefit from skilled therapeutic intervention in order to improve the following deficits and impairments:  Decreased range of motion, Decreased activity tolerance, Increased fascial restrictions, Impaired UE functional use, Pain, Decreased mobility, Decreased strength  Visit Diagnosis: Other symptoms and signs involving the musculoskeletal system  Acute pain of left shoulder  Stiffness of left shoulder, not elsewhere classified    Problem  List There are no active problems to display for this patient.  Ailene Ravel, OTR/L,CBIS  [phone]  08/06/2018, 11:08 AM  [place] [address] [place], [place], [address] Phone: [phone]   Fax:  [phone]  Name: CLOVIS MANKINS MRN: [id number] Date of Birth: 1960-08-03

## 2018-08-08 ENCOUNTER — Encounter (HOSPITAL_COMMUNITY): Payer: Self-pay | Admitting: Occupational Therapy

## 2018-08-08 ENCOUNTER — Ambulatory Visit (HOSPITAL_COMMUNITY): Payer: BLUE CROSS/BLUE SHIELD | Admitting: Occupational Therapy

## 2018-08-08 DIAGNOSIS — R29898 Other symptoms and signs involving the musculoskeletal system: Secondary | ICD-10-CM | POA: Diagnosis not present

## 2018-08-08 DIAGNOSIS — M25612 Stiffness of left shoulder, not elsewhere classified: Secondary | ICD-10-CM | POA: Diagnosis not present

## 2018-08-08 DIAGNOSIS — M25512 Pain in left shoulder: Secondary | ICD-10-CM | POA: Diagnosis not present

## 2018-08-08 NOTE — Therapy (Signed)
[place], [place], [address] Phone: [phone]   Fax:  [phone]  Occupational Therapy Reassessment, Treatment, and Discharge  Patient Details  Name: Benjamin Gill MRN: [id number] Date of Birth: Dec 17, 1960 Referring Provider (OT): Dr. Becky Sax  Progress Note Reporting Period 06/18/2018 to 08/08/2018  See note below for Objective Data and Assessment of Progress/Goals.       Encounter Date: 08/08/2018  OT End of Session - 08/08/18 1059    Visit Number  16    Number of Visits  16    Date for OT Re-Evaluation  08/17/18    Authorization Type  [employer]    Authorization Time Period  no visit limit    OT Start Time  1032    OT Stop Time  1056    OT Time Calculation (min)  24 min    Activity Tolerance  Patient tolerated treatment well    Behavior During Therapy  WFL for tasks assessed/performed       History reviewed. No pertinent past medical history.  History reviewed. No pertinent surgical history.  There were no vitals filed for this visit.  Subjective Assessment - 08/08/18 1030    Subjective   S: It's feeling good.     Currently in Pain?  No/denies         [facility] OT Assessment - 08/08/18 1030      Assessment   Medical Diagnosis  s/p closed displaced clavicle shaft fx      Precautions   Precautions  Shoulder    Type of Shoulder Precautions  Gentle progressive weightbearing allowed over the next 2 weeks, no lifting over 10#. Per MD note. 07/10/18      Observation/Other Assessments   Focus on Therapeutic Outcomes (FOTO)   67/100   62/100 previous     Palpation   Palpation comment  min fascial restrictions at anterior shoulder, trapezius, and scapularis regions      AROM   Overall AROM Comments  Assessed seated, er/IR adducted    AROM Assessment Site  Shoulder    Right/Left Shoulder  Left    Left Shoulder Flexion  154 Degrees   145 previous   Left Shoulder ABduction  175 Degrees   154  previous   Left Shoulder Internal Rotation  90 Degrees   same as previous   Left Shoulder External Rotation  61 Degrees   56 previous     PROM   Overall PROM Comments  Assessed supine, er/IR adducted    PROM Assessment Site  Shoulder    Right/Left Shoulder  Left    Left Shoulder Flexion  160 Degrees   153 previous   Left Shoulder ABduction  180 Degrees   same as previous   Left Shoulder Internal Rotation  90 Degrees   same as previous   Left Shoulder External Rotation  61 Degrees   60 previous (equivalent to RUE)     Strength   Overall Strength Comments  Assessed seated, er/IR adducted    Strength Assessment Site  Shoulder    Right/Left Shoulder  Left    Left Shoulder Flexion  4+/5   same as previous   Left Shoulder ABduction  4+/5   4/5 previous   Left Shoulder Internal Rotation  5/5   4+/5 previous   Left Shoulder External Rotation  4/5   3/5 previous              OT Treatments/Exercises (OP) - 08/08/18  1034      Exercises   Exercises  Shoulder      Shoulder Exercises: Supine   Protraction  PROM;5 reps    Horizontal ABduction  PROM;5 reps    External Rotation  PROM;5 reps    Internal Rotation  PROM;5 reps    Flexion  PROM;5 reps    ABduction  PROM;5 reps      Shoulder Exercises: Standing   Horizontal ABduction  Theraband;10 reps    Theraband Level (Shoulder Horizontal ABduction)  Level 3 (Green)    External Rotation  Theraband;10 reps    Theraband Level (Shoulder External Rotation)  Level 3 (Green)    Internal Rotation  Theraband;10 reps    Theraband Level (Shoulder Internal Rotation)  Level 3 (Green)    Diagonals  Theraband;10 reps    Theraband Level (Shoulder Diagonals)  Level 3 (Green)      Manual Therapy   Manual Therapy  Myofascial release    Manual therapy comments  Manual therapy completed prior to exercises.     Soft tissue mobilization  Myofascial release and manual stretching completed to left upper arm, trapezius, and scapularis region  to decrease fascial restrictions and increase joint mobility in a pain free zone.             OT Education - 08/08/18 1049    Education Details  green theraband strengthening    Person(s) Educated  Patient    Methods  Explanation;Demonstration;Handout    Comprehension  Verbalized understanding;Returned demonstration       OT Short Term Goals - 08/08/18 1054      OT SHORT TERM GOAL #1   Title  Pt will be provided with and educated on HEP to improve ability to use LUE during ADL tasks.     Time  4    Period  Weeks    Status  Achieved      OT SHORT TERM GOAL #2   Title  Pt will improve P/ROM of LUE to [facility] to improve ability to use LUE as assist during ADL completion.     Time  4    Period  Weeks      OT SHORT TERM GOAL #3   Title  Pt will improve LUE strength to 3+/5 to improve ability to reach for items above waist level.     Time  4    Period  Weeks    Status  Achieved      OT SHORT TERM GOAL #4   Title  Pt will decrease pain in LUE to 4/10 to improve ability to sleep in his bed.     Time  4    Period  Weeks        OT Long Term Goals - 08/08/18 1055      OT LONG TERM GOAL #1   Title  Pt will return to highest level of functioning using LUE as non-dominant during ADL and work tasks.     Time  8    Period  Weeks    Status  Achieved      OT LONG TERM GOAL #2   Title  Pt will decrease pain in LUE to 2/10 or less to improve ability to using LUE as non-dominant during dressing tasks.     Time  8    Period  Weeks    Status  Achieved      OT LONG TERM GOAL #3   Title  Pt will improve A/ROM of LUE  to [facility] to improve ability to reach for items overhead and behind back.     Time  8    Period  Weeks    Status  Achieved      OT LONG TERM GOAL #4   Title  Pt will increase strength in LUE to 4+/5 to improve ability to lift and carry weighted objects during ADL and work tasks.     Time  8    Period  Weeks    Status  Achieved      OT LONG TERM GOAL #5   Title  Pt  will decrease LUE fascial restrictions from mod to minimal amounts or less to improve mobility required for functional reaching tasks.     Time  8    Period  Weeks    Status  Achieved            Plan - 08/08/18 1059    Clinical Impression Statement  A: Reassessment completed this session, pt has met all goals and has made excellent progress with LUE functioning. Pt reports occasional fatigue, is able to complete ADL and work tasks without difficulty. Pt performing LUE strengthening with green theraband today, updated for HEP. Pt is agreeable to discharge.     Plan  P: Discharge pt       Patient will benefit from skilled therapeutic intervention in order to improve the following deficits and impairments:  Decreased range of motion, Decreased activity tolerance, Increased fascial restrictions, Impaired UE functional use, Pain, Decreased mobility, Decreased strength  Visit Diagnosis: Other symptoms and signs involving the musculoskeletal system  Acute pain of left shoulder  Stiffness of left shoulder, not elsewhere classified    Problem List There are no active problems to display for this patient.  Guadelupe Sabin, OTR/L  [phone] 08/08/2018, 11:01 AM  [place] [address] [place], [place], [address] Phone: [phone]   Fax:  [phone]  Name: GLENWOOD REVOIR MRN: [id number] Date of Birth: 01-11-1961   OCCUPATIONAL THERAPY DISCHARGE SUMMARY  Visits from Start of Care: 16  Current functional level related to goals / functional outcomes: See above. Pt is independent in ADLs, no difficulty with tasks.   Remaining deficits: Occasional muscle fatigue   Education / Equipment: Nyoka Cowden theraband strengthening  Plan: Patient agrees to discharge.  Patient goals were met. Patient is being discharged due to meeting the stated rehab goals.  ?????

## 2018-08-08 NOTE — Patient Instructions (Signed)
Strengthening: Chest Pull - Resisted   Hold Theraband in front of body with hands about shoulder width a part. Pull band a part and back together slowly. Repeat _10___ times. Complete __1__ set(s) per session.. Repeat _1___ session(s) per day.    PNF Strengthening: Resisted   Standing with resistive band around each hand, bring left arm up and away, thumb back. Repeat _10___ times per set. Do ___1_ sets per session. Do __1__ sessions per day.                           Resisted External Rotation: in Neutral - Bilateral   Sit or stand, tubing in both hands, elbows at sides, bent to 90, forearms forward. Pinch shoulder blades together and rotate forearms out. Keep elbows at sides. Repeat __10__ times per set. Do _1___ sets per session. Do __1__ sessions per day.    PNF Strengthening: Resisted   Standing, hold resistive band above head. Bring left arm down and out from side. Repeat __10__ times per set. Do __1__ sets per session. Do ___1_ sessions per day.

## 2018-08-13 DIAGNOSIS — Z23 Encounter for immunization: Secondary | ICD-10-CM | POA: Diagnosis not present

## 2018-08-13 DIAGNOSIS — L570 Actinic keratosis: Secondary | ICD-10-CM | POA: Diagnosis not present

## 2018-08-21 DIAGNOSIS — S42022D Displaced fracture of shaft of left clavicle, subsequent encounter for fracture with routine healing: Secondary | ICD-10-CM | POA: Diagnosis not present

## 2018-11-13 DIAGNOSIS — L814 Other melanin hyperpigmentation: Secondary | ICD-10-CM | POA: Diagnosis not present

## 2018-11-13 DIAGNOSIS — D1801 Hemangioma of skin and subcutaneous tissue: Secondary | ICD-10-CM | POA: Diagnosis not present

## 2018-11-13 DIAGNOSIS — L719 Rosacea, unspecified: Secondary | ICD-10-CM | POA: Diagnosis not present

## 2019-06-06 DIAGNOSIS — L570 Actinic keratosis: Secondary | ICD-10-CM | POA: Diagnosis not present

## 2019-06-06 DIAGNOSIS — Z23 Encounter for immunization: Secondary | ICD-10-CM | POA: Diagnosis not present

## 2019-12-10 DIAGNOSIS — Z86018 Personal history of other benign neoplasm: Secondary | ICD-10-CM | POA: Diagnosis not present

## 2019-12-10 DIAGNOSIS — L570 Actinic keratosis: Secondary | ICD-10-CM | POA: Diagnosis not present

## 2019-12-10 DIAGNOSIS — L814 Other melanin hyperpigmentation: Secondary | ICD-10-CM | POA: Diagnosis not present

## 2019-12-10 DIAGNOSIS — Z8589 Personal history of malignant neoplasm of other organs and systems: Secondary | ICD-10-CM | POA: Diagnosis not present

## 2019-12-10 DIAGNOSIS — D1801 Hemangioma of skin and subcutaneous tissue: Secondary | ICD-10-CM | POA: Diagnosis not present

## 2020-03-31 DIAGNOSIS — L570 Actinic keratosis: Secondary | ICD-10-CM | POA: Diagnosis not present

## 2020-06-29 ENCOUNTER — Encounter: Payer: Self-pay | Admitting: Family Medicine

## 2020-06-29 ENCOUNTER — Other Ambulatory Visit: Payer: Self-pay

## 2020-06-29 ENCOUNTER — Ambulatory Visit: Payer: BC Managed Care – PPO | Admitting: Family Medicine

## 2020-06-29 VITALS — BP 108/60 | HR 74 | Temp 97.5°F | Ht 70.0 in | Wt 179.2 lb

## 2020-06-29 DIAGNOSIS — Z6825 Body mass index (BMI) 25.0-25.9, adult: Secondary | ICD-10-CM

## 2020-06-29 DIAGNOSIS — Z1211 Encounter for screening for malignant neoplasm of colon: Secondary | ICD-10-CM

## 2020-06-29 DIAGNOSIS — R1011 Right upper quadrant pain: Secondary | ICD-10-CM

## 2020-06-29 NOTE — Patient Instructions (Signed)

## 2020-06-29 NOTE — Progress Notes (Addendum)
New Patient Office Visit  Subjective:  Patient ID: Benjamin Gill, male    DOB: Oct 22, 1960  Age: 59 y.o. MRN: [id number]  CC:  Chief Complaint  Patient presents with  . New Patient (Initial Visit)    HPI Benjamin Gill presents to establish care. He has a history of macular degeneration in his left eye. He last saw his eye doctor a few months ago. He reports that he is doing well overall. He has been fasting for about 3 hours. He has one concern today:  1. Stomach pain Benjamin Gill reports stomach pain for 2-3 months. It is a dull achy pain in his RUQ and lasts for a few seconds at a time. It usually occurs after eating, but also occurs at random times. It occurs daily. He denies fever, weight loss, vomiting, nausea, diarrhea, constipation, or blood in his stool. He does have heartburn. He has heartburn every once in awhile. The heartburn is worse at night when he lays down. He has tried Prilosec with good relief but he prefers not to take medication. He has a family history of Celiacs disease and pancreatitis.   Past Medical History:  Diagnosis Date  . Macular degeneration    left eye    Past Surgical History:  Procedure Laterality Date  . WRIST FRACTURE SURGERY Bilateral 2011    History reviewed. No pertinent family history.  Social History   Socioeconomic History  . Marital status: Married    Spouse name: Not on file  . Number of children: 2  . Years of education: 5  . Highest education level: High school graduate  Occupational History    Employer: [employer]  Tobacco Use  . Smoking status: Former Smoker    Packs/day: 1.00    Years: 40.00    Pack years: 40.00    Types: Cigarettes    Quit date: 06/29/2018    Years since quitting: 2.0  . Smokeless tobacco: Never Used  Vaping Use  . Vaping Use: Never used  Substance and Sexual Activity  . Alcohol use: Not Currently  . Drug use: Not Currently  . Sexual activity: Yes  Other Topics Concern  . Not on file  Social History  Narrative  . Not on file   Social Determinants of Health   Financial Resource Strain: Not on file  Food Insecurity: Not on file  Transportation Needs: Not on file  Physical Activity: Not on file  Stress: Not on file  Social Connections: Not on file  Intimate Partner Violence: Not on file    ROS Review of Systems Negative unless specially indicated above in HPI.  Objective:   Today's Vitals: BP 108/60   Pulse 74   Temp (!) 97.5 F (36.4 C) (Temporal)   Ht 5' 10"  (1.778 m)   Wt 179 lb 4 oz (81.3 kg)   BMI 25.72 kg/m   Physical Exam Vitals and nursing note reviewed.  Constitutional:      General: He is not in acute distress.    Appearance: Normal appearance. He is not ill-appearing, toxic-appearing or diaphoretic.  HENT:     Head: Normocephalic and atraumatic.  Eyes:     Extraocular Movements: Extraocular movements intact.     Pupils: Pupils are equal, round, and reactive to light.  Cardiovascular:     Rate and Rhythm: Regular rhythm.     Heart sounds: Normal heart sounds. No murmur heard.   Pulmonary:     Effort: Pulmonary effort is normal. No respiratory distress.  Breath sounds: Normal breath sounds.  Abdominal:     General: Bowel sounds are normal. There is no distension.     Palpations: Abdomen is soft. There is no shifting dullness or mass.     Tenderness: There is no abdominal tenderness. There is no guarding or rebound. Negative signs include Murphy's sign and McBurney's sign.     Hernia: No hernia is present.  Musculoskeletal:     Right lower leg: No edema.     Left lower leg: No edema.  Skin:    General: Skin is warm and dry.  Neurological:     General: No focal deficit present.     Mental Status: He is alert and oriented to person, place, and time.  Psychiatric:        Mood and Affect: Mood normal.        Behavior: Behavior normal.        Thought Content: Thought content normal.        Judgment: Judgment normal.    Assessment & Plan:    Deniz was seen today for new patient (initial visit).  Diagnoses and all orders for this visit:  Colon cancer screening -     Cologuard  RUQ pain Normal exam. Labs pending as below. May need imaging and/or referral to GI. Return to office for new or worsening symptoms, or if symptoms persist.   -     Lipase -     CMP14+EGFR -     CBC with Differential/Platelet -     Celiac panel  BMI 25.0-25.9,adult Labs pending as below.  -     Lipid panel -     TSH -     CBC with Differential/Platelet  Follow-up: 1 year for CPE, sooner if needed.   The patient indicates understanding of these issues and agrees with the plan.  Benjamin Perking, FNP

## 2020-06-30 LAB — CMP14+EGFR
ALT: 29 IU/L (ref 0–44)
AST: 25 IU/L (ref 0–40)
Albumin/Globulin Ratio: 1.7 (ref 1.2–2.2)
Albumin: 4 g/dL (ref 3.8–4.9)
Alkaline Phosphatase: 115 IU/L (ref 44–121)
BUN/Creatinine Ratio: 16 (ref 9–20)
BUN: 21 mg/dL (ref 6–24)
Bilirubin Total: <0.2 mg/dL (ref 0.0–1.2)
CO2: 23 mmol/L (ref 20–29)
Calcium: 9.2 mg/dL (ref 8.7–10.2)
Chloride: 102 mmol/L (ref 96–106)
Creatinine, Ser: 1.28 mg/dL — ABNORMAL HIGH (ref 0.76–1.27)
GFR calc Af Amer: 70 mL/min/{1.73_m2} (ref >59)
GFR calc non Af Amer: 61 mL/min/{1.73_m2} (ref >59)
Globulin, Total: 2.4 g/dL (ref 1.5–4.5)
Glucose: 78 mg/dL (ref 65–99)
Potassium: 4.1 mmol/L (ref 3.5–5.2)
Sodium: 140 mmol/L (ref 134–144)
Total Protein: 6.4 g/dL (ref 6.0–8.5)

## 2020-06-30 LAB — CBC WITH DIFFERENTIAL/PLATELET
Basophils Absolute: 0.1 10*3/uL (ref 0.0–0.2)
Basos: 1 %
EOS (ABSOLUTE): 0.2 10*3/uL (ref 0.0–0.4)
Eos: 2 %
Hematocrit: 38.7 % (ref 37.5–51.0)
Hemoglobin: 12.8 g/dL — ABNORMAL LOW (ref 13.0–17.7)
Immature Grans (Abs): 0 10*3/uL (ref 0.0–0.1)
Immature Granulocytes: 0 %
Lymphocytes Absolute: 1.9 10*3/uL (ref 0.7–3.1)
Lymphs: 19 %
MCH: 30.7 pg (ref 26.6–33.0)
MCHC: 33.1 g/dL (ref 31.5–35.7)
MCV: 93 fL (ref 79–97)
Monocytes Absolute: 0.8 10*3/uL (ref 0.1–0.9)
Monocytes: 8 %
Neutrophils Absolute: 6.9 10*3/uL (ref 1.4–7.0)
Neutrophils: 70 %
Platelets: 271 10*3/uL (ref 150–450)
RBC: 4.17 x10E6/uL (ref 4.14–5.80)
RDW: 12 % (ref 11.6–15.4)
WBC: 10 10*3/uL (ref 3.4–10.8)

## 2020-06-30 LAB — LIPID PANEL
Chol/HDL Ratio: 4.7 ratio (ref 0.0–5.0)
Cholesterol, Total: 194 mg/dL (ref 100–199)
HDL: 41 mg/dL (ref >39)
LDL Chol Calc (NIH): 83 mg/dL (ref 0–99)
Triglycerides: 434 mg/dL — ABNORMAL HIGH (ref 0–149)
VLDL Cholesterol Cal: 70 mg/dL — ABNORMAL HIGH (ref 5–40)

## 2020-06-30 LAB — LIPASE: Lipase: 21 U/L (ref 13–78)

## 2020-06-30 LAB — TSH: TSH: 1.68 u[IU]/mL (ref 0.450–4.500)

## 2020-07-01 LAB — CELIAC DISEASE PANEL
Endomysial IgA: NEGATIVE
IgA/Immunoglobulin A, Serum: 218 mg/dL (ref 90–386)
Transglutaminase IgA: <2 U/mL (ref 0–3)

## 2020-07-01 LAB — SPECIMEN STATUS REPORT

## 2020-08-13 ENCOUNTER — Other Ambulatory Visit: Payer: Self-pay | Admitting: Family Medicine

## 2020-08-13 ENCOUNTER — Telehealth: Payer: Self-pay

## 2020-08-13 DIAGNOSIS — R1011 Right upper quadrant pain: Secondary | ICD-10-CM

## 2020-08-13 NOTE — Telephone Encounter (Signed)
Patient aware.

## 2020-08-13 NOTE — Telephone Encounter (Signed)
Order placed for RUQ [place] at [facility]

## 2020-08-13 NOTE — Telephone Encounter (Signed)
Called patient lmtcb 

## 2020-08-13 NOTE — Telephone Encounter (Signed)
Pt rc for nurse 

## 2020-08-13 NOTE — Telephone Encounter (Signed)
Reason for Referral: abdomen pain Referral discussed with patient: yes Best contact number of patient for referral team: [phone]   Has patient been seen by a specialist for this issue before: no Patient provider preference for referral: Forestine Na  Patient location preference for referral: [place]

## 2020-08-14 ENCOUNTER — Telehealth: Payer: Self-pay | Admitting: Family Medicine

## 2020-08-18 DIAGNOSIS — Z1212 Encounter for screening for malignant neoplasm of rectum: Secondary | ICD-10-CM | POA: Diagnosis not present

## 2020-08-21 ENCOUNTER — Other Ambulatory Visit: Payer: Self-pay

## 2020-08-21 ENCOUNTER — Ambulatory Visit (HOSPITAL_COMMUNITY)
Admission: RE | Admit: 2020-08-21 | Discharge: 2020-08-21 | Disposition: A | Discharge status: Home / Self Care | Payer: BC Managed Care – PPO | Source: Ambulatory Visit | Attending: Family Medicine | Admitting: Family Medicine

## 2020-08-21 DIAGNOSIS — R109 Unspecified abdominal pain: Secondary | ICD-10-CM | POA: Diagnosis not present

## 2020-08-21 DIAGNOSIS — R1011 Right upper quadrant pain: Secondary | ICD-10-CM | POA: Insufficient documentation

## 2020-08-24 ENCOUNTER — Other Ambulatory Visit: Payer: Self-pay | Admitting: Family Medicine

## 2020-08-24 DIAGNOSIS — R195 Other fecal abnormalities: Secondary | ICD-10-CM

## 2020-08-24 DIAGNOSIS — Z1211 Encounter for screening for malignant neoplasm of colon: Secondary | ICD-10-CM

## 2020-08-24 LAB — COLOGUARD: Cologuard: POSITIVE — AB

## 2020-09-01 ENCOUNTER — Other Ambulatory Visit: Payer: Self-pay

## 2020-09-01 ENCOUNTER — Encounter: Payer: Self-pay | Admitting: Gastroenterology

## 2020-09-01 ENCOUNTER — Encounter: Payer: Self-pay | Admitting: *Deleted

## 2020-09-01 ENCOUNTER — Ambulatory Visit (INDEPENDENT_AMBULATORY_CARE_PROVIDER_SITE_OTHER): Payer: BC Managed Care – PPO | Admitting: Gastroenterology

## 2020-09-01 DIAGNOSIS — R195 Other fecal abnormalities: Secondary | ICD-10-CM | POA: Insufficient documentation

## 2020-09-01 MED ORDER — PEG 3350-KCL-NA BICARB-NACL 420 G PO SOLR: 4000.0000 mL | ORAL | 0 refills | Status: DC

## 2020-09-01 NOTE — Progress Notes (Signed)
Primary Care Physician:  Gwenlyn Perking, FNP  Primary Gastroenterologist:  Elon Alas. Abbey Chatters, DO   Chief Complaint  Patient presents with  . positive cologuard    Never had TCS prior    HPI:  Benjamin Gill is a 60 y.o. male here at the request of Marjorie Smolder, FNP for further evaluation of positive Cologuard testing.   Some intermittent epigastric pain. Had ruq u/s in 06/2020 which was unremarkable. Thinks it may be gas. Sometimes worse with meals. Sometimes heartburn intermittent once or twice per week. Will take some OTC antacids. BM regular. No melena, brbpr. Occasional solid food dysphagia.   No FH of colon cancer. Father has celiac disease. Patient had negative celiac serologies.    Current Outpatient Medications  Medication Sig Dispense Refill  . Multiple Vitamins-Minerals (ICAPS AREDS 2 PO) Take by mouth daily.     No current facility-administered medications for this visit.    Allergies as of 09/01/2020  . (No Known Allergies)    Past Medical History:  Diagnosis Date  . Macular degeneration    left eye    Past Surgical History:  Procedure Laterality Date  . WRIST FRACTURE SURGERY Bilateral 2011    Family History  Problem Relation Age of Onset  . Celiac disease Father   . Colon cancer Neg Hx     Social History   Socioeconomic History  . Marital status: Married    Spouse name: Not on file  . Number of children: 2  . Years of education: 72  . Highest education level: High school graduate  Occupational History    Employer: [employer]  Tobacco Use  . Smoking status: Former Smoker    Packs/day: 1.00    Years: 40.00    Pack years: 40.00    Types: Cigarettes    Quit date: 06/29/2018    Years since quitting: 2.1  . Smokeless tobacco: Never Used  Vaping Use  . Vaping Use: Never used  Substance and Sexual Activity  . Alcohol use: Not Currently  . Drug use: Not Currently  . Sexual activity: Yes  Other Topics Concern  . Not on file  Social  History Narrative  . Not on file   Social Determinants of Health   Financial Resource Strain: Not on file  Food Insecurity: Not on file  Transportation Needs: Not on file  Physical Activity: Not on file  Stress: Not on file  Social Connections: Not on file  Intimate Partner Violence: Not on file      ROS:  General: Negative for anorexia, weight loss, fever, chills, fatigue, weakness. Eyes: Negative for vision changes.  ENT: Negative for hoarseness, difficulty swallowing , nasal congestion. CV: Negative for chest pain, angina, palpitations, dyspnea on exertion, peripheral edema.  Respiratory: Negative for dyspnea at rest, dyspnea on exertion, cough, sputum, wheezing.  GI: See history of present illness. GU:  Negative for dysuria, hematuria, urinary incontinence, urinary frequency, nocturnal urination.  MS: Negative for joint pain, low back pain.  Derm: Negative for rash or itching.  Neuro: Negative for weakness, abnormal sensation, seizure, frequent headaches, memory loss, confusion.  Psych: Negative for anxiety, depression, suicidal ideation, hallucinations.  Endo: Negative for unusual weight change.  Heme: Negative for bruising or bleeding. Allergy: Negative for rash or hives.    Physical Examination:  BP 108/67   Pulse 77   Temp (!) 97 F (36.1 C)   Ht 5\' 10"  (1.778 m)   Wt 183 lb 12.8 oz (83.4 kg)  BMI 26.37 kg/m    General: Well-nourished, well-developed in no acute distress.  Head: Normocephalic, atraumatic.   Eyes: Conjunctiva pink, no icterus. Mouth: masked Neck: Supple without thyromegaly, masses, or lymphadenopathy.  Lungs: Clear to auscultation bilaterally.  Heart: Regular rate and rhythm, no murmurs rubs or gallops.  Abdomen: Bowel sounds are normal, nontender, nondistended, no hepatosplenomegaly or masses, no abdominal bruits or    hernia , no rebound or guarding.   Rectal: not peformed Extremities: No lower extremity edema. No clubbing or  deformities.  Neuro: Alert and oriented x 4 , grossly normal neurologically.  Skin: Warm and dry, no rash or jaundice.   Psych: Alert and cooperative, normal mood and affect.  Labs: Lab Results  Component Value Date   CREATININE 1.28 (H) 06/29/2020   BUN 21 06/29/2020   NA 140 06/29/2020   K 4.1 06/29/2020   CL 102 06/29/2020   CO2 23 06/29/2020   Lab Results  Component Value Date   WBC 10.0 06/29/2020   HGB 12.8 (L) 06/29/2020   HCT 38.7 06/29/2020   MCV 93 06/29/2020   PLT 271 06/29/2020   Lab Results  Component Value Date   TSH 1.680 06/29/2020   Lab Results  Component Value Date   ALT 29 06/29/2020   AST 25 06/29/2020   ALKPHOS 115 06/29/2020   BILITOT <0.2 06/29/2020   Lab Results  Component Value Date   LIPASE 21 06/29/2020   TTG IgA <2, IgA 218.    Imaging Studies: US Abdomen Limited RUQ (LIVER/GB)  Result Date: 08/22/2020 CLINICAL DATA:  60 year old male with right upper quadrant abdominal pain. EXAM: ULTRASOUND ABDOMEN LIMITED RIGHT UPPER QUADRANT COMPARISON:  None. FINDINGS: Gallbladder: No gallstones or wall thickening visualized. No sonographic Murphy sign noted by sonographer. Common bile duct: Diameter: 4 mm Liver: No focal lesion identified. Within normal limits in parenchymal echogenicity. Portal vein is patent on color Doppler imaging with normal direction of blood flow towards the liver. Other: None. IMPRESSION: Unremarkable right upper quadrant ultrasound. Electronically Signed   By: Anner Crete M.D.   On: 08/22/2020 23:45    Assessment/plan:  60 y/o male presenting for further evaluation of positive Cologuard. Patient has never had a colonoscopy. He denies FH of colon cancer. Patient has history of intermittent heartburn 1-2 times per week, intermittent epigastric pain, occasional solid food dysphagia.   Discussed positive cologuard could be due to blood in stool or precancerous polyps/colon cancer. Needs colonoscopy to further evaluate.   Plan on a colonoscopy with Dr. Abbey Chatters in the near future. ASA I.  I have discussed the risks, alternatives, benefits with regards to but not limited to the risk of reaction to medication, bleeding, infection, perforation and the patient is agreeable to proceed. Written consent to be obtained.  With regards to upper GI symptoms, he has occasional heartburn which responds to over-the-counter antacids.  More recently has had some intermittent solid food dysphagia.  Intermittent epigastric pain with unremarkable ultrasound.  We discussed adding PPI therapy plus or minus upper endoscopy at this time.  We discussed potential etiologies for dysphagia including untreated reflux, stricture, malignancy.  At this time patient does not want to start medication or pursue upper endoscopy.  He is interested in pursuing colonoscopy first, further work-up to follow.

## 2020-09-01 NOTE — Progress Notes (Signed)
Cc'ed to pcp °

## 2020-09-01 NOTE — Patient Instructions (Signed)
1. Colonoscopy as scheduled. See separate instructions.  2. Please let us know if your swallowing issues become more frequent or you have persisting abdominal pain. Would consider adding medication for acid reflux or upper endoscopy to rule out stricture of esophagus.

## 2020-10-02 ENCOUNTER — Other Ambulatory Visit (HOSPITAL_COMMUNITY)
Admission: RE | Admit: 2020-10-02 | Discharge: 2020-10-02 | Disposition: A | Discharge status: Home / Self Care | Payer: BC Managed Care – PPO | Source: Ambulatory Visit | Attending: Internal Medicine | Admitting: Internal Medicine

## 2020-10-02 ENCOUNTER — Other Ambulatory Visit: Payer: Self-pay

## 2020-10-02 DIAGNOSIS — D122 Benign neoplasm of ascending colon: Secondary | ICD-10-CM | POA: Diagnosis not present

## 2020-10-02 DIAGNOSIS — Z01812 Encounter for preprocedural laboratory examination: Secondary | ICD-10-CM | POA: Insufficient documentation

## 2020-10-02 DIAGNOSIS — Z8379 Family history of other diseases of the digestive system: Secondary | ICD-10-CM | POA: Diagnosis not present

## 2020-10-02 DIAGNOSIS — K648 Other hemorrhoids: Secondary | ICD-10-CM | POA: Diagnosis not present

## 2020-10-02 DIAGNOSIS — D123 Benign neoplasm of transverse colon: Secondary | ICD-10-CM | POA: Diagnosis not present

## 2020-10-02 DIAGNOSIS — D125 Benign neoplasm of sigmoid colon: Secondary | ICD-10-CM | POA: Diagnosis not present

## 2020-10-02 DIAGNOSIS — Z20822 Contact with and (suspected) exposure to covid-19: Secondary | ICD-10-CM | POA: Insufficient documentation

## 2020-10-02 DIAGNOSIS — Z87891 Personal history of nicotine dependence: Secondary | ICD-10-CM | POA: Diagnosis not present

## 2020-10-02 DIAGNOSIS — R195 Other fecal abnormalities: Secondary | ICD-10-CM | POA: Diagnosis not present

## 2020-10-02 LAB — BASIC METABOLIC PANEL
Anion gap: 8 (ref 5–15)
BUN: 29 mg/dL — ABNORMAL HIGH (ref 6–20)
CO2: 22 mmol/L (ref 22–32)
Calcium: 8.5 mg/dL — ABNORMAL LOW (ref 8.9–10.3)
Chloride: 104 mmol/L (ref 98–111)
Creatinine, Ser: 1.14 mg/dL (ref 0.61–1.24)
GFR, Estimated: >60 mL/min (ref >60)
Glucose, Bld: 111 mg/dL — ABNORMAL HIGH (ref 70–99)
Potassium: 3.8 mmol/L (ref 3.5–5.1)
Sodium: 134 mmol/L — ABNORMAL LOW (ref 135–145)

## 2020-10-03 LAB — SARS CORONAVIRUS 2 (TAT 6-24 HRS): SARS Coronavirus 2: NEGATIVE

## 2020-10-05 ENCOUNTER — Ambulatory Visit (HOSPITAL_COMMUNITY)
Admission: RE | Admit: 2020-10-05 | Discharge: 2020-10-05 | Disposition: A | Discharge status: Home / Self Care | Payer: BC Managed Care – PPO | Attending: Internal Medicine | Admitting: Internal Medicine

## 2020-10-05 ENCOUNTER — Encounter (HOSPITAL_COMMUNITY): Payer: Self-pay

## 2020-10-05 ENCOUNTER — Encounter (HOSPITAL_COMMUNITY)
Admission: RE | Disposition: A | Discharge status: Home / Self Care | Payer: Self-pay | Source: Home / Self Care | Attending: Internal Medicine

## 2020-10-05 ENCOUNTER — Ambulatory Visit (HOSPITAL_COMMUNITY): Payer: BC Managed Care – PPO | Admitting: Certified Registered Nurse Anesthetist

## 2020-10-05 ENCOUNTER — Other Ambulatory Visit: Payer: Self-pay

## 2020-10-05 DIAGNOSIS — D125 Benign neoplasm of sigmoid colon: Secondary | ICD-10-CM | POA: Diagnosis not present

## 2020-10-05 DIAGNOSIS — D122 Benign neoplasm of ascending colon: Secondary | ICD-10-CM | POA: Diagnosis not present

## 2020-10-05 DIAGNOSIS — Z8379 Family history of other diseases of the digestive system: Secondary | ICD-10-CM | POA: Diagnosis not present

## 2020-10-05 DIAGNOSIS — Z8589 Personal history of malignant neoplasm of other organs and systems: Secondary | ICD-10-CM | POA: Diagnosis not present

## 2020-10-05 DIAGNOSIS — Z87891 Personal history of nicotine dependence: Secondary | ICD-10-CM | POA: Diagnosis not present

## 2020-10-05 DIAGNOSIS — R195 Other fecal abnormalities: Secondary | ICD-10-CM | POA: Diagnosis not present

## 2020-10-05 DIAGNOSIS — L570 Actinic keratosis: Secondary | ICD-10-CM | POA: Diagnosis not present

## 2020-10-05 DIAGNOSIS — D123 Benign neoplasm of transverse colon: Secondary | ICD-10-CM | POA: Diagnosis not present

## 2020-10-05 DIAGNOSIS — L814 Other melanin hyperpigmentation: Secondary | ICD-10-CM | POA: Diagnosis not present

## 2020-10-05 DIAGNOSIS — Z20822 Contact with and (suspected) exposure to covid-19: Secondary | ICD-10-CM | POA: Diagnosis not present

## 2020-10-05 DIAGNOSIS — D1801 Hemangioma of skin and subcutaneous tissue: Secondary | ICD-10-CM | POA: Diagnosis not present

## 2020-10-05 DIAGNOSIS — Z86018 Personal history of other benign neoplasm: Secondary | ICD-10-CM | POA: Diagnosis not present

## 2020-10-05 DIAGNOSIS — K635 Polyp of colon: Secondary | ICD-10-CM | POA: Diagnosis not present

## 2020-10-05 DIAGNOSIS — K648 Other hemorrhoids: Secondary | ICD-10-CM | POA: Insufficient documentation

## 2020-10-05 HISTORY — PX: COLONOSCOPY WITH PROPOFOL: SHX5780

## 2020-10-05 HISTORY — PX: POLYPECTOMY: SHX5525

## 2020-10-05 SURGERY — COLONOSCOPY WITH PROPOFOL
Anesthesia: General

## 2020-10-05 MED ORDER — PROPOFOL 500 MG/50ML IV EMUL
INTRAVENOUS | Status: DC | PRN
  Administered 2020-10-05: 125 ug/kg/min via INTRAVENOUS

## 2020-10-05 MED ORDER — LIDOCAINE HCL (CARDIAC) PF 100 MG/5ML IV SOSY
PREFILLED_SYRINGE | INTRAVENOUS | Status: DC | PRN
  Administered 2020-10-05: 50 mg via INTRAVENOUS

## 2020-10-05 MED ORDER — LACTATED RINGERS IV SOLN: INTRAVENOUS | Status: DC

## 2020-10-05 MED ORDER — PROPOFOL 10 MG/ML IV BOLUS
INTRAVENOUS | Status: DC | PRN
  Administered 2020-10-05: 50 mg via INTRAVENOUS
  Administered 2020-10-05: 100 mg via INTRAVENOUS
  Administered 2020-10-05: 30 mg via INTRAVENOUS
  Administered 2020-10-05: 40 mg via INTRAVENOUS

## 2020-10-05 NOTE — Anesthesia Postprocedure Evaluation (Signed)
Anesthesia Post Note  Patient: Benjamin Gill  Procedure(s) Performed: COLONOSCOPY WITH PROPOFOL (N/A ) POLYPECTOMY  Patient location during evaluation: PACU Anesthesia Type: General Level of consciousness: awake Pain management: pain level controlled Vital Signs Assessment: post-procedure vital signs reviewed and stable Respiratory status: spontaneous breathing and respiratory function stable Cardiovascular status: blood pressure returned to baseline and stable Postop Assessment: no headache and no apparent nausea or vomiting Anesthetic complications: no   No complications documented.   Last Vitals:  Vitals:   10/05/20 0847 10/05/20 0853  BP: 99/70   Pulse: 77   Resp: 18   Temp:    SpO2: 92% 93%    Last Pain:  Vitals:   10/05/20 0847  TempSrc:   PainSc: 0-No pain                 Louann Sjogren

## 2020-10-05 NOTE — Discharge Instructions (Addendum)
Colonoscopy Discharge Instructions  Read the instructions outlined below and refer to this sheet in the next few weeks. These discharge instructions provide you with general information on caring for yourself after you leave the hospital. Your doctor may also give you specific instructions. While your treatment has been planned according to the most current medical practices available, unavoidable complications occasionally occur.   ACTIVITY  You may resume your regular activity, but move at a slower pace for the next 24 hours.   Take frequent rest periods for the next 24 hours.   Walking will help get rid of the air and reduce the bloated feeling in your belly (abdomen).   No driving for 24 hours (because of the medicine (anesthesia) used during the test).    Do not sign any important legal documents or operate any machinery for 24 hours (because of the anesthesia used during the test).  NUTRITION  Drink plenty of fluids.   You may resume your normal diet as instructed by your doctor.   Begin with a light meal and progress to your normal diet. Heavy or fried foods are harder to digest and may make you feel sick to your stomach (nauseated).   Avoid alcoholic beverages for 24 hours or as instructed.  MEDICATIONS  You may resume your normal medications unless your doctor tells you otherwise.  WHAT YOU CAN EXPECT TODAY  Some feelings of bloating in the abdomen.   Passage of more gas than usual.   Spotting of blood in your stool or on the toilet paper.  IF YOU HAD POLYPS REMOVED DURING THE COLONOSCOPY:  No aspirin products for 7 days or as instructed.   No alcohol for 7 days or as instructed.   Eat a soft diet for the next 24 hours.  FINDING OUT THE RESULTS OF YOUR TEST Not all test results are available during your visit. If your test results are not back during the visit, make an appointment with your caregiver to find out the results. Do not assume everything is normal if  you have not heard from your caregiver or the medical facility. It is important for you to follow up on all of your test results.  SEEK IMMEDIATE MEDICAL ATTENTION IF:  You have more than a spotting of blood in your stool.   Your belly is swollen (abdominal distention).   You are nauseated or vomiting.   You have a temperature over 101.   You have abdominal pain or discomfort that is severe or gets worse throughout the day.   Your colonoscopy revealed 7 polyp(s) which I removed successfully. Await pathology results, my office will contact you. I recommend repeating colonoscopy in 3 years for surveillance purposes. You also have diverticulosis and internal hemorrhoids. I would recommend increasing fiber in your diet or adding OTC Benefiber/Metamucil. Be sure to drink at least 4 to 6 glasses of water daily. Follow-up with GI as needed.    I hope you have a great rest of your week!  Elon Alas. Abbey Chatters, D.O. Gastroenterology and Hepatology [facility]   Colon Polyps  Colon polyps are tissue growths inside the colon, which is part of the large intestine. They are one of the types of polyps that can grow in the body. A polyp may be a round bump or a mushroom-shaped growth. You could have one polyp or more than one. Most colon polyps are noncancerous (benign). However, some colon polyps can become cancerous over time. Finding and removing the polyps early  can help prevent this. What are the causes? The exact cause of colon polyps is not known. What increases the risk? The following factors may make you more likely to develop this condition:  Having a family history of colorectal cancer or colon polyps.  Being older than 60 years of age.  Being younger than 60 years of age and having a significant family history of colorectal cancer or colon polyps or a genetic condition that puts you at higher risk of getting colon polyps.  Having inflammatory bowel disease,  such as ulcerative colitis or Crohn's disease.  Having certain conditions passed from parent to child (hereditary conditions), such as: ? Familial adenomatous polyposis (FAP). ? Lynch syndrome. ? Turcot syndrome. ? Peutz-Jeghers syndrome. ? MUTYH-associated polyposis (MAP).  Being overweight.  Certain lifestyle factors. These include smoking cigarettes, drinking too much alcohol, not getting enough exercise, and eating a diet that is high in fat and red meat and low in fiber.  Having had childhood cancer that was treated with radiation of the abdomen. What are the signs or symptoms? Many times, there are no symptoms. If you have symptoms, they may include:  Blood coming from the rectum during a bowel movement.  Blood in the stool (feces). The blood may be bright red or very dark in color.  Pain in the abdomen.  A change in bowel habits, such as constipation or diarrhea. How is this diagnosed? This condition is diagnosed with a colonoscopy. This is a procedure in which a lighted, flexible scope is inserted into the opening between the buttocks (anus) and then passed into the colon to examine the area. Polyps are sometimes found when a colonoscopy is done as part of routine cancer screening tests. How is this treated? This condition is treated by removing any polyps that are found. Most polyps can be removed during a colonoscopy. Those polyps will then be tested for cancer. Additional treatment may be needed depending on the results of testing. Follow these instructions at home: Eating and drinking  Eat foods that are high in fiber, such as fruits, vegetables, and whole grains.  Eat foods that are high in calcium and vitamin D, such as milk, cheese, yogurt, eggs, liver, fish, and broccoli.  Limit foods that are high in fat, such as fried foods and desserts.  Limit the amount of red meat, precooked or cured meat, or other processed meat that you eat, such as hot dogs, sausages,  bacon, or meat loaves.  Limit sugary drinks.   Lifestyle  Maintain a healthy weight, or lose weight if recommended by your health care provider.  Exercise every day or as told by your health care provider.  Do not use any products that contain nicotine or tobacco, such as cigarettes, e-cigarettes, and chewing tobacco. If you need help quitting, ask your health care provider.  Do not drink alcohol if: ? Your health care provider tells you not to drink. ? You are pregnant, may be pregnant, or are planning to become pregnant.  If you drink alcohol: ? Limit how much you use to:  0-1 drink a day for women.  0-2 drinks a day for men. ? Know how much alcohol is in your drink. In the [place] one drink equals one 12 oz bottle of beer (355 mL), one 5 oz glass of wine (148 mL), or one 1 oz glass of hard liquor (44 mL). General instructions  Take over-the-counter and prescription medicines only as told by your health care provider.  Keep  all follow-up visits. This is important. This includes having regularly scheduled colonoscopies. Talk to your health care provider about when you need a colonoscopy. Contact a health care provider if:  You have new or worsening bleeding during a bowel movement.  You have new or increased blood in your stool.  You have a change in bowel habits.  You lose weight for no known reason. Summary  Colon polyps are tissue growths inside the colon, which is part of the large intestine. They are one type of polyp that can grow in the body.  Most colon polyps are noncancerous (benign), but some can become cancerous over time.  This condition is diagnosed with a colonoscopy.  This condition is treated by removing any polyps that are found. Most polyps can be removed during a colonoscopy. This information is not intended to replace advice given to you by your health care provider. Make sure you discuss any questions you have with your health care  provider. Document Revised: 10/09/2019 Document Reviewed: 10/09/2019 Elsevier Patient Education  2021 [place].  Diverticulosis  Diverticulosis is a condition that develops when small pouches (diverticula) form in the wall of the large intestine (colon). The colon is where water is absorbed and stool (feces) is formed. The pouches form when the inside layer of the colon pushes through weak spots in the outer layers of the colon. You may have a few pouches or many of them. The pouches usually do not cause problems unless they become inflamed or infected. When this happens, the condition is called diverticulitis. What are the causes? The cause of this condition is not known. What increases the risk? The following factors may make you more likely to develop this condition:  Being older than age 39. Your risk for this condition increases with age. Diverticulosis is rare among people younger than age 19. By age 64, many people have it.  Eating a low-fiber diet.  Having frequent constipation.  Being overweight.  Not getting enough exercise.  Smoking.  Taking over-the-counter pain medicines, like aspirin and ibuprofen.  Having a family history of diverticulosis. What are the signs or symptoms? In most people, there are no symptoms of this condition. If you do have symptoms, they may include:  Bloating.  Cramps in the abdomen.  Constipation or diarrhea.  Pain in the lower left side of the abdomen. How is this diagnosed? Because diverticulosis usually has no symptoms, it is most often diagnosed during an exam for other colon problems. The condition may be diagnosed by:  Using a flexible scope to examine the colon (colonoscopy).  Taking an X-ray of the colon after dye has been put into the colon (barium enema).  Having a CT scan. How is this treated? You may not need treatment for this condition. Your health care provider may recommend treatment to prevent problems. You may  need treatment if you have symptoms or if you previously had diverticulitis. Treatment may include:  Eating a high-fiber diet.  Taking a fiber supplement.  Taking a live bacteria supplement (probiotic).  Taking medicine to relax your colon.   Follow these instructions at home: Medicines  Take over-the-counter and prescription medicines only as told by your health care provider.  If told by your health care provider, take a fiber supplement or probiotic. Constipation prevention Your condition may cause constipation. To prevent or treat constipation, you may need to:  Drink enough fluid to keep your urine pale yellow.  Take over-the-counter or prescription medicines.  Eat foods  that are high in fiber, such as beans, whole grains, and fresh fruits and vegetables.  Limit foods that are high in fat and processed sugars, such as fried or sweet foods.   General instructions  Try not to strain when you have a bowel movement.  Keep all follow-up visits as told by your health care provider. This is important. Contact a health care provider if you:  Have pain in your abdomen.  Have bloating.  Have cramps.  Have not had a bowel movement in 3 days. Get help right away if:  Your pain gets worse.  Your bloating becomes very bad.  You have a fever or chills, and your symptoms suddenly get worse.  You vomit.  You have bowel movements that are bloody or black.  You have bleeding from your rectum. Summary  Diverticulosis is a condition that develops when small pouches (diverticula) form in the wall of the large intestine (colon).  You may have a few pouches or many of them.  This condition is most often diagnosed during an exam for other colon problems.  Treatment may include increasing the fiber in your diet, taking supplements, or taking medicines. This information is not intended to replace advice given to you by your health care provider. Make sure you discuss any  questions you have with your health care provider. Document Revised: 01/17/2019 Document Reviewed: 01/17/2019 Elsevier Patient Education  2021 [place].  Hemorrhoids Hemorrhoids are swollen veins that may develop:  In the butt (rectum). These are called internal hemorrhoids.  Around the opening of the butt (anus). These are called external hemorrhoids. Hemorrhoids can cause pain, itching, or bleeding. Most of the time, they do not cause serious problems. They usually get better with diet changes, lifestyle changes, and other home treatments. What are the causes? This condition may be caused by:  Having trouble pooping (constipation).  Pushing hard (straining) to poop.  Watery poop (diarrhea).  Pregnancy.  Being very overweight (obese).  Sitting for long periods of time.  Heavy lifting or other activity that causes you to strain.  Anal sex.  Riding a bike for a long period of time. What are the signs or symptoms? Symptoms of this condition include:  Pain.  Itching or soreness in the butt.  Bleeding from the butt.  Leaking poop.  Swelling in the area.  One or more lumps around the opening of your butt. How is this diagnosed? A doctor can often diagnose this condition by looking at the affected area. The doctor may also:  Do an exam that involves feeling the area with a gloved hand (digital rectal exam).  Examine the area inside your butt using a small tube (anoscope).  Order blood tests. This may be done if you have lost a lot of blood.  Have you get a test that involves looking inside the colon using a flexible tube with a camera on the end (sigmoidoscopy or colonoscopy). How is this treated? This condition can usually be treated at home. Your doctor may tell you to change what you eat, make lifestyle changes, or try home treatments. If these do not help, procedures can be done to remove the hemorrhoids or make them smaller. These may involve:  Placing  rubber bands at the base of the hemorrhoids to cut off their blood supply.  Injecting medicine into the hemorrhoids to shrink them.  Shining a type of light energy onto the hemorrhoids to cause them to fall off.  Doing surgery to remove the  hemorrhoids or cut off their blood supply. Follow these instructions at home: Eating and drinking  Eat foods that have a lot of fiber in them. These include whole grains, beans, nuts, fruits, and vegetables.  Ask your doctor about taking products that have added fiber (fibersupplements).  Reduce the amount of fat in your diet. You can do this by: ? Eating low-fat dairy products. ? Eating less red meat. ? Avoiding processed foods.  Drink enough fluid to keep your pee (urine) pale yellow.   Managing pain and swelling  Take a warm-water bath (sitz bath) for 20 minutes to ease pain. Do this 3-4 times a day. You may do this in a bathtub or using a portable sitz bath that fits over the toilet.  If told, put ice on the painful area. It may be helpful to use ice between your warm baths. ? Put ice in a plastic bag. ? Place a towel between your skin and the bag. ? Leave the ice on for 20 minutes, 2-3 times a day.   General instructions  Take over-the-counter and prescription medicines only as told by your doctor. ? Medicated creams and medicines may be used as told.  Exercise often. Ask your doctor how much and what kind of exercise is best for you.  Go to the bathroom when you have the urge to poop. Do not wait.  Avoid pushing too hard when you poop.  Keep your butt dry and clean. Use wet toilet paper or moist towelettes after pooping.  Do not sit on the toilet for a long time.  Keep all follow-up visits as told by your doctor. This is important. Contact a doctor if you:  Have pain and swelling that do not get better with treatment or medicine.  Have trouble pooping.  Cannot poop.  Have pain or swelling outside the area of the  hemorrhoids. Get help right away if you have:  Bleeding that will not stop. Summary  Hemorrhoids are swollen veins in the butt or around the opening of the butt.  They can cause pain, itching, or bleeding.  Eat foods that have a lot of fiber in them. These include whole grains, beans, nuts, fruits, and vegetables.  Take a warm-water bath (sitz bath) for 20 minutes to ease pain. Do this 3-4 times a day. This information is not intended to replace advice given to you by your health care provider. Make sure you discuss any questions you have with your health care provider. Document Revised: 06/28/2018 Document Reviewed: 11/09/2017 Elsevier Patient Education  2021 [place].  High-Fiber Eating Plan Fiber, also called dietary fiber, is a type of carbohydrate. It is found foods such as fruits, vegetables, whole grains, and beans. A high-fiber diet can have many health benefits. Your health care provider may recommend a high-fiber diet to help:  Prevent constipation. Fiber can make your bowel movements more regular.  Lower your cholesterol.  Relieve the following conditions: ? Inflammation of veins in the anus (hemorrhoids). ? Inflammation of specific areas of the digestive tract (uncomplicated diverticulosis). ? A problem of the large intestine, also called the colon, that sometimes causes pain and diarrhea (irritable bowel syndrome, or IBS).  Prevent overeating as part of a weight-loss plan.  Prevent heart disease, type 2 diabetes, and certain cancers. What are tips for following this plan? Reading food labels  Check the nutrition facts label on food products for the amount of dietary fiber. Choose foods that have 5 grams of fiber or more  per serving.  The goals for recommended daily fiber intake include: ? Men (age 56 or younger): 34-38 g. ? Men (over age 64): 28-34 g. ? Women (age 44 or younger): 25-28 g. ? Women (over age 53): 22-25 g. Your daily fiber goal is  _____________ g.   Shopping  Choose whole fruits and vegetables instead of processed forms, such as apple juice or applesauce.  Choose a wide variety of high-fiber foods such as avocados, lentils, oats, and kidney beans.  Read the nutrition facts label of the foods you choose. Be aware of foods with added fiber. These foods often have high sugar and sodium amounts per serving. Cooking  Use whole-grain flour for baking and cooking.  Cook with brown rice instead of white rice. Meal planning  Start the day with a breakfast that is high in fiber, such as a cereal that contains 5 g of fiber or more per serving.  Eat breads and cereals that are made with whole-grain flour instead of refined flour or white flour.  Eat brown rice, bulgur wheat, or millet instead of white rice.  Use beans in place of meat in soups, salads, and pasta dishes.  Be sure that half of the grains you eat each day are whole grains. General information  You can get the recommended daily intake of dietary fiber by: ? Eating a variety of fruits, vegetables, grains, nuts, and beans. ? Taking a fiber supplement if you are not able to take in enough fiber in your diet. It is better to get fiber through food than from a supplement.  Gradually increase how much fiber you consume. If you increase your intake of dietary fiber too quickly, you may have bloating, cramping, or gas.  Drink plenty of water to help you digest fiber.  Choose high-fiber snacks, such as berries, raw vegetables, nuts, and popcorn. What foods should I eat? Fruits Berries. Pears. Apples. Oranges. Avocado. Prunes and raisins. Dried figs. Vegetables Sweet potatoes. Spinach. Kale. Artichokes. Cabbage. Broccoli. Cauliflower. Green peas. Carrots. Squash. Grains Whole-grain breads. Multigrain cereal. Oats and oatmeal. Brown rice. Barley. Bulgur wheat. [place]. Quinoa. Bran muffins. Popcorn. Rye wafer crackers. Meats and other proteins Navy beans,  kidney beans, and pinto beans. Soybeans. Split peas. Lentils. Nuts and seeds. Dairy Fiber-fortified yogurt. Beverages Fiber-fortified soy milk. Fiber-fortified orange juice. Other foods Fiber bars. The items listed above may not be a complete list of recommended foods and beverages. Contact a dietitian for more information. What foods should I avoid? Fruits Fruit juice. Cooked, strained fruit. Vegetables Fried potatoes. Canned vegetables. Well-cooked vegetables. Grains White bread. Pasta made with refined flour. White rice. Meats and other proteins Fatty cuts of meat. Fried chicken or fried fish. Dairy Milk. Yogurt. Cream cheese. Sour cream. Fats and oils Butters. Beverages Soft drinks. Other foods Cakes and pastries. The items listed above may not be a complete list of foods and beverages to avoid. Talk with your dietitian about what choices are best for you. Summary  Fiber is a type of carbohydrate. It is found in foods such as fruits, vegetables, whole grains, and beans.  A high-fiber diet has many benefits. It can help to prevent constipation, lower blood cholesterol, aid weight loss, and reduce your risk of heart disease, diabetes, and certain cancers.  Increase your intake of fiber gradually. Increasing fiber too quickly may cause cramping, bloating, and gas. Drink plenty of water while you increase the amount of fiber you consume.  The best sources of fiber include whole fruits  and vegetables, whole grains, nuts, seeds, and beans. This information is not intended to replace advice given to you by your health care provider. Make sure you discuss any questions you have with your health care provider. Document Revised: 10/24/2019 Document Reviewed: 10/24/2019 Elsevier Patient Education  2021 [employer].

## 2020-10-05 NOTE — OR Nursing (Signed)
Pt O2 saturation maintaining at 88% on room air. Placed on 2L [place], maintaining O2 saturation at 90%-91%. Dr. Lisette Grinder notified and aware. After moving patient to recliner chair, coughing, and blowing his nose. O2 saturation maintained at 92%-93% on RA. Pt showed no signs of distress.

## 2020-10-05 NOTE — Op Note (Signed)
[facility] Patient Name: Benjamin Gill Procedure Date: 10/05/2020 7:53 AM MRN: [id number] Date of Birth: 1961-05-05 Attending MD: Elon Alas. Abbey Chatters DO CSN: [id number] Age: 60 Admit Type: Outpatient Procedure:                Colonoscopy Indications:              Positive Cologuard test Providers:                Elon Alas. Abbey Chatters, DO, Gwenlyn Fudge, RN, Dereck Leep, Technician Referring MD:              Medicines:                See the Anesthesia note for documentation of the                            administered medications Complications:            No immediate complications. Estimated Blood Loss:     Estimated blood loss was minimal. Procedure:                Pre-Anesthesia Assessment:                           - The anesthesia plan was to use monitored                            anesthesia care (MAC).                           After obtaining informed consent, the colonoscope                            was passed under direct vision. Throughout the                            procedure, the patient's blood pressure, pulse, and                            oxygen saturations were monitored continuously. The                            [id number]-HQ190L ([id number]) scope was introduced through                            the anus and advanced to the the cecum, identified                            by appendiceal orifice and ileocecal valve. The                            colonoscopy was performed without difficulty. The                            patient tolerated the procedure well. The  quality                            of the bowel preparation was evaluated using the                            BBPS [facility] Bowel Preparation Scale) with scores                            of: Right Colon = 3, Transverse Colon = 3 and Left                            Colon = 3 (entire mucosa seen well with no residual                            staining, small fragments of  stool or opaque                            liquid). The total BBPS score equals 9. Scope In: 8:02:54 AM Scope Out: 8:26:31 AM Scope Withdrawal Time: 0 hours 17 minutes 58 seconds  Total Procedure Duration: 0 hours 23 minutes 37 seconds  Findings:      The perianal and digital rectal examinations were normal.      Non-bleeding internal hemorrhoids were found during endoscopy.      Two sessile polyps were found in the ascending colon. The polyps were 4       to 6 mm in size. These polyps were removed with a cold snare. Resection       and retrieval were complete.      Four sessile polyps were found in the transverse colon. The polyps were       5 to 7 mm in size. These polyps were removed with a cold snare.       Resection and retrieval were complete.      A 15 mm polyp was found in the sigmoid colon. The polyp was       pedunculated. The polyp was removed with a hot snare. Resection and       retrieval were complete. Impression:               - Non-bleeding internal hemorrhoids.                           - Two 4 to 6 mm polyps in the ascending colon,                            removed with a cold snare. Resected and retrieved.                           - Four 5 to 7 mm polyps in the transverse colon,                            removed with a cold snare. Resected and retrieved.                           - One 15  mm polyp in the sigmoid colon, removed                            with a hot snare. Resected and retrieved. Moderate Sedation:      Per Anesthesia Care Recommendation:           - Patient has a contact number available for                            emergencies. The signs and symptoms of potential                            delayed complications were discussed with the                            patient. Return to normal activities tomorrow.                            Written discharge instructions were provided to the                            patient.                            - Resume previous diet.                           - Continue present medications.                           - Await pathology results.                           - Repeat colonoscopy in 3 years for surveillance.                           - Return to GI clinic PRN. Procedure Code(s):        --- Professional ---                           [id number], Colonoscopy, flexible; with removal of                            tumor(s), polyp(s), or other lesion(s) by snare                            technique Diagnosis Code(s):        --- Professional ---                           K63.5, Polyp of colon                           K64.8, Other hemorrhoids                           R19.5, Other fecal abnormalities CPT copyright 2019 American  Medical Association. All rights reserved. The codes documented in this report are preliminary and upon coder review may  be revised to meet current compliance requirements. Elon Alas. Abbey Chatters, DO Pope Abbey Chatters, DO 10/05/2020 9:02:19 AM This report has been signed electronically. Number of Addenda: 0

## 2020-10-05 NOTE — Transfer of Care (Signed)
Immediate Anesthesia Transfer of Care Note  Patient: Benjamin Gill  Procedure(s) Performed: COLONOSCOPY WITH PROPOFOL (N/A ) POLYPECTOMY  Patient Location: PACU  Anesthesia Type:General  Level of Consciousness: awake and alert   Airway & Oxygen Therapy: Patient Spontanous Breathing and Patient connected to nasal cannula oxygen  Post-op Assessment: Report given to RN and Post -op Vital signs reviewed and stable  Post vital signs: Reviewed and stable  Last Vitals:  Vitals Value Taken Time  BP 102/63 10/05/20 0830  Temp 36.8 C 10/05/20 0830  Pulse    Resp 16 10/05/20 0830  SpO2 91%     Last Pain:  Vitals:   10/05/20 0830  TempSrc: Oral  PainSc:       Patients Stated Pain Goal: 2 (72/09/47 0962)  Complications: No complications documented.

## 2020-10-05 NOTE — H&P (Signed)
Primary Care Physician:  Gwenlyn Perking, FNP Primary Gastroenterologist:  Dr. Abbey Chatters  Pre-Procedure History & Physical: HPI:  Benjamin Gill is a 60 y.o. male is here for a colonoscopy to be performed due to positive cologuard testing. No FH of colon cancer. Father has celiac disease. Patient had negative celiac serologies.   Past Medical History:  Diagnosis Date  . Macular degeneration    left eye    Past Surgical History:  Procedure Laterality Date  . WRIST FRACTURE SURGERY Bilateral 2011    Prior to Admission medications   Medication Sig Start Date End Date Taking? Authorizing Provider  cholecalciferol (VITAMIN D3) 25 MCG (1000 UNIT) tablet Take 1,000 Units by mouth daily.   Yes [provider]  Multiple Vitamins-Minerals (ICAPS AREDS 2 PO) Take 1 capsule by mouth daily.   Yes [provider]  zinc gluconate 50 MG tablet Take 50 mg by mouth daily.   Yes [provider]  polyethylene glycol-electrolytes (TRILYTE) 420 g solution Take 4,000 mLs by mouth as directed. 09/01/20   Eloise Harman, DO    Allergies as of 09/01/2020  . (No Known Allergies)    Family History  Problem Relation Age of Onset  . Celiac disease Father   . Colon cancer Neg Hx     Social History   Socioeconomic History  . Marital status: Married    Spouse name: Not on file  . Number of children: 2  . Years of education: 53  . Highest education level: High school graduate  Occupational History    Employer: [employer]  Tobacco Use  . Smoking status: Former Smoker    Packs/day: 1.00    Years: 40.00    Pack years: 40.00    Types: Cigarettes    Quit date: 06/29/2018    Years since quitting: 2.2  . Smokeless tobacco: Never Used  Vaping Use  . Vaping Use: Never used  Substance and Sexual Activity  . Alcohol use: Not Currently  . Drug use: Not Currently  . Sexual activity: Yes  Other Topics Concern  . Not on file  Social History Narrative  . Not on file   Social  Determinants of Health   Financial Resource Strain: Not on file  Food Insecurity: Not on file  Transportation Needs: Not on file  Physical Activity: Not on file  Stress: Not on file  Social Connections: Not on file  Intimate Partner Violence: Not on file    Review of Systems: See HPI, otherwise negative ROS  Physical Exam: Vital signs in last 24 hours:     General:   Alert,  Well-developed, well-nourished, pleasant and cooperative in NAD Head:  Normocephalic and atraumatic. Eyes:  Sclera clear, no icterus.   Conjunctiva pink. Ears:  Normal auditory acuity. Nose:  No deformity, discharge,  or lesions. Mouth:  No deformity or lesions, dentition normal. Neck:  Supple; no masses or thyromegaly. Lungs:  Clear throughout to auscultation.   No wheezes, crackles, or rhonchi. No acute distress. Heart:  Regular rate and rhythm; no murmurs, clicks, rubs,  or gallops. Abdomen:  Soft, nontender and nondistended. No masses, hepatosplenomegaly or hernias noted. Normal bowel sounds, without guarding, and without rebound.   Msk:  Symmetrical without gross deformities. Normal posture. Extremities:  Without clubbing or edema. Neurologic:  Alert and  oriented x4;  grossly normal neurologically. Skin:  Intact without significant lesions or rashes. Cervical Nodes:  No significant cervical adenopathy. Psych:  Alert and cooperative. Normal mood and  affect.  Impression/Plan: Benjamin Gill is here for a colonoscopy to be performed due to positive cologuard testing  The risks of the procedure including infection, bleed, or perforation as well as benefits, limitations, alternatives and imponderables have been reviewed with the patient. Questions have been answered. All parties agreeable.

## 2020-10-05 NOTE — Anesthesia Preprocedure Evaluation (Signed)
Anesthesia Evaluation  Patient identified by MRN, date of birth, ID band Patient awake    Reviewed: Allergy & Precautions, H&P , NPO status , Patient's Chart, lab work & pertinent test results, reviewed documented beta blocker date and time   Airway Mallampati: II  TM Distance: >3 FB Neck ROM: full    Dental no notable dental hx.    Pulmonary neg pulmonary ROS, former smoker,    Pulmonary exam normal breath sounds clear to auscultation       Cardiovascular Exercise Tolerance: Good negative cardio ROS   Rhythm:regular Rate:Normal     Neuro/Psych negative neurological ROS  negative psych ROS   GI/Hepatic negative GI ROS, Neg liver ROS,   Endo/Other  negative endocrine ROS  Renal/GU negative Renal ROS  negative genitourinary   Musculoskeletal   Abdominal   Peds  Hematology negative hematology ROS (+)   Anesthesia Other Findings   Reproductive/Obstetrics negative OB ROS                             Anesthesia Physical Anesthesia Plan  ASA: I  Anesthesia Plan: General   Post-op Pain Management:    Induction:   PONV Risk Score and Plan: Propofol infusion  Airway Management Planned:   Additional Equipment:   Intra-op Plan:   Post-operative Plan:   Informed Consent: I have reviewed the patients History and Physical, chart, labs and discussed the procedure including the risks, benefits and alternatives for the proposed anesthesia with the patient or authorized representative who has indicated his/her understanding and acceptance.     Dental Advisory Given  Plan Discussed with: CRNA  Anesthesia Plan Comments:         Anesthesia Quick Evaluation

## 2020-10-06 LAB — SURGICAL PATHOLOGY

## 2020-10-09 ENCOUNTER — Encounter (HOSPITAL_COMMUNITY): Payer: Self-pay | Admitting: Internal Medicine

## 2021-01-21 DIAGNOSIS — L570 Actinic keratosis: Secondary | ICD-10-CM | POA: Diagnosis not present

## 2021-03-23 ENCOUNTER — Ambulatory Visit: Payer: BC Managed Care – PPO | Admitting: Family

## 2021-03-23 ENCOUNTER — Encounter: Payer: Self-pay | Admitting: Family

## 2021-03-23 DIAGNOSIS — L03032 Cellulitis of left toe: Secondary | ICD-10-CM | POA: Diagnosis not present

## 2021-03-23 MED ORDER — CEPHALEXIN 500 MG PO CAPS: 500.0000 mg | ORAL_CAPSULE | Freq: Three times a day (TID) | ORAL | 0 refills | Status: DC

## 2021-03-23 NOTE — Progress Notes (Signed)
   Virtual Visit  Note Due to COVID-19 pandemic this visit was conducted virtually. This visit type was conducted due to national recommendations for restrictions regarding the COVID-19 Pandemic (e.g. social distancing, sheltering in place) in an effort to limit this patient's exposure and mitigate transmission in our community. All issues noted in this document were discussed and addressed.  A physical exam was not performed with this format.  I connected with Benjamin Gill on 03/23/21 at 11:30 AM by telephone and verified that I am speaking with the correct person using two identifiers. Benjamin Gill is currently located at work and no one is currently with him during visit. The provider, Evelina Dun, FNP is located in their office at time of visit.  I discussed the limitations, risks, security and privacy concerns of performing an evaluation and management service by telephone and the availability of in person appointments. I also discussed with the patient that there may be a patient responsible charge related to this service. The patient expressed understanding and agreed to proceed.   History and Present Illness:  HPI Pt calls the office today with left great toe pain and swelling that he noticed last week. He reports it has worsen and is very swollen, tender, red, and warm.   He has picked at it trying to let it drain. It has drained a slight redness, brown.   He reports aching pain 6 out 10 when the area is touched.   Review of Systems  All other systems reviewed and are negative.   Observations/Objective: No SOB or distress noted   Assessment and Plan: 1. Paronychia of toe of left foot Keep clean and dry Do not pick Soak in warm water Start Keflex today and let us know if area worsens  - cephALEXin (KEFLEX) 500 MG capsule; Take 1 capsule (500 mg total) by mouth 3 (three) times daily.  Dispense: 21 capsule; Refill: 0    I discussed the assessment and treatment plan with the  patient. The patient was provided an opportunity to ask questions and all were answered. The patient agreed with the plan and demonstrated an understanding of the instructions.   The patient was advised to call back or seek an in-person evaluation if the symptoms worsen or if the condition fails to improve as anticipated.  The above assessment and management plan was discussed with the patient. The patient verbalized understanding of and has agreed to the management plan. Patient is aware to call the clinic if symptoms persist or worsen. Patient is aware when to return to the clinic for a follow-up visit. Patient educated on when it is appropriate to go to the emergency department.   Time call ended:  11:43 AM   I provided 13 minutes of  non face-to-face time during this encounter.    Evelina Dun, FNP

## 2021-03-23 NOTE — Patient Instructions (Signed)
Paronychia Paronychia is an infection of the skin that surrounds a nail. It usually affects the skin around a fingernail, but it may also occur near a toenail. It often causes pain and swelling around the nail. In some cases, a collection of pus (abscess) can form near or under the nail.  This condition may develop suddenly, or it may develop gradually over a longer period. In most cases, paronychia is not serious, and it will clear up with treatment. What are the causes? This condition may be caused by bacteria or a fungus, such as yeast. The bacteria or fungus can enter the body through an opening in the skin, such as a cut or a hangnail, and cause an infection in your fingernail or toenail. Other causes may include: Recurrent injury to the fingernail or toenail area. Irritation of the base and sides of the nail (cuticle). Injury and irritation can result in inflammation, swelling, and thickened skin around the nail. What increases the risk? This condition is more likely to develop in people who: Get their hands wet often, such as those who work as Designer, industrial/product, bartenders, or housekeepers. Bite their fingernails or cuticles. Have underlying skin conditions. Have hangnails or injured fingertips. Are exposed to irritants like detergents and other chemicals. Have diabetes. What are the signs or symptoms? Symptoms of this condition include: Redness and swelling of the skin near the nail. Tenderness around the nail when you touch the area. Pus-filled bumps under the cuticle. Fluid or pus under the nail. Throbbing pain in the area. How is this diagnosed? This condition is diagnosed with a physical exam. In some cases, a sample of pus may be tested to determine what type of bacteria or fungus is causing the condition. How is this treated? Treatment depends on the cause and severity of your condition. If your condition is mild, it may clear up on its own in a few days or after soaking in warm  water. If needed, treatment may include: Antibiotic medicine, if your infection is caused by bacteria. Antifungal medicine, if your infection is caused by a fungus. A procedure to drain pus from an abscess. Anti-inflammatory medicine (corticosteroids). Removal of part of an ingrown toenail. A bandage (dressing) may be placed over the affected area if an abscess or part of a nail has been removed. Follow these instructions at home: Wound care Keep the affected area clean. Soak the affected area in warm water if told to do so by your health care provider. You may be told to do this for 20 minutes, 2-3 times a day. Keep the area dry when you are not soaking it. Do not try to drain an abscess yourself. Follow instructions from your health care provider about how to take care of the affected area. Make sure you: Wash your hands with soap and water for at least 20 seconds before and after you change your dressing. If soap and water are not available, use hand sanitizer. Change your dressing as told by your health care provider. If you had an abscess drained, check the area every day for signs of infection. Check for: Redness, swelling, or pain. Fluid or blood. Warmth. Pus or a bad smell. Medicines  Take over-the-counter and prescription medicines only as told by your health care provider. If you were prescribed an antibiotic medicine, take it as told by your health care provider. Do not stop taking the antibiotic even if you start to feel better. General instructions Avoid contact with any skin irritants or allergens.  Do not pick at the affected area. Keep all follow-up visits as told. This is important. Prevention To prevent this condition from happening again: Wear rubber gloves when washing dishes or doing other tasks that require your hands to get wet. Wear gloves if your hands might come in contact with cleaners or other chemicals. Avoid injuring your nails or fingertips. Do not bite  your nails or tear hangnails. Do not cut your nails very short. Do not cut your cuticles. Use clean nail clippers or scissors when trimming nails. Contact a health care provider if: Your symptoms get worse or do not improve with treatment. You have continued or increased fluid, blood, or pus coming from the affected area. Your affected finger, toe, or joint becomes swollen or difficult to move. You have a fever or chills. There is redness spreading away from the affected area. Summary Paronychia is an infection of the skin that surrounds a nail. It often causes pain and swelling around the nail. In some cases, a collection of pus (abscess) can form near or under the nail. This condition may be caused by bacteria or a fungus. These germs can enter the body through an opening in the skin, such as a cut or a hangnail. If your condition is mild, it may clear up on its own in a few days. If needed, treatment may include medicine or a procedure to drain pus from an abscess. To prevent this condition from happening again, wear gloves if doing tasks that require your hands to get wet or to come in contact with chemicals. Also avoid injuring your nails or fingertips. This information is not intended to replace advice given to you by your health care provider. Make sure you discuss any questions you have with your health care provider. Document Revised: 09/21/2020 Document Reviewed: 09/21/2020 Elsevier Patient Education  [place] [employer].

## 2021-04-01 ENCOUNTER — Telehealth: Payer: Self-pay

## 2021-04-01 MED ORDER — SULFAMETHOXAZOLE-TRIMETHOPRIM 800-160 MG PO TABS: 1.0000 | ORAL_TABLET | Freq: Two times a day (BID) | ORAL | 0 refills | Status: DC

## 2021-04-01 NOTE — Telephone Encounter (Signed)
Finished ATB this week for toe infection. Believes it is still infected and would like another round of ATB.  Can he come here for removal of ingrown nail or does he need a referral?

## 2021-04-01 NOTE — Telephone Encounter (Signed)
Bactrim Prescription sent to pharmacy. Pleas make an appt with MMM. Pt notified.   Evelina Dun, FNP

## 2021-04-15 ENCOUNTER — Encounter: Payer: Self-pay | Admitting: Family Medicine

## 2021-04-15 ENCOUNTER — Other Ambulatory Visit: Payer: Self-pay

## 2021-04-15 ENCOUNTER — Ambulatory Visit: Payer: BC Managed Care – PPO | Admitting: Family Medicine

## 2021-04-15 VITALS — BP 108/57 | HR 80 | Temp 97.7°F | Ht 70.0 in | Wt 181.2 lb

## 2021-04-15 DIAGNOSIS — L600 Ingrowing nail: Secondary | ICD-10-CM | POA: Diagnosis not present

## 2021-04-15 MED ORDER — DOXYCYCLINE HYCLATE 100 MG PO TABS: 100.0000 mg | ORAL_TABLET | Freq: Two times a day (BID) | ORAL | 0 refills | Status: AC

## 2021-04-15 NOTE — Patient Instructions (Signed)
Ingrown Toenail An ingrown toenail occurs when the corner or sides of a toenail grow into the surrounding skin. This causes discomfort and pain. The big toe is most commonly affected, but any of the toes can be affected. If an ingrown toenail is nottreated, it can become infected. What are the causes? This condition may be caused by: Wearing shoes that are too small or tight. An injury, such as stubbing your toe or having your toe stepped on. Improper cutting or care of your toenails. Having nail or foot abnormalities that were present from birth (congenital abnormalities), such as having a nail that is too big for your toe. What increases the risk? The following factors may make you more likely to develop ingrown toenails: Age. Nails tend to get thicker with age, so ingrown nails are more common among older people. Cutting your toenails incorrectly, such as cutting them very short or cutting them unevenly. An ingrown toenail is more likely to get infected if you have: Diabetes. Blood flow (circulation) problems. What are the signs or symptoms? Symptoms of an ingrown toenail may include: Pain, soreness, or tenderness. Redness. Swelling. Hardening of the skin that surrounds the toenail. Signs that an ingrown toenail may be infected include: Fluid or pus. Symptoms that get worse instead of better. How is this diagnosed? An ingrown toenail may be diagnosed based on your medical history, your symptoms, and a physical exam. If you have fluid or blood coming from your toenail, a sample may be collected to test for the specific type of bacteriathat is causing the infection. How is this treated? Treatment depends on how severe your ingrown toenail is. You may be able to care for your toenail at home. If you have an infection, you may be prescribed antibiotic medicines. If you have fluid or pus draining from your toenail, your health care provider may drain it. If you have trouble walking, you  may be given crutches to use. If you have a severe or infected ingrown toenail, you may need a procedure to remove part or all of the nail. Follow these instructions at home: Foot care  Do not pick at your toenail or try to remove it yourself. Soak your foot in warm, soapy water. Do this for 20 minutes, 3 times a day, or as often as told by your health care provider. This helps to keep your toe clean and keep your skin soft. Wear shoes that fit well and are not too tight. Your health care provider may recommend that you wear open-toed shoes while you heal. Trim your toenails regularly and carefully. Cut your toenails straight across to prevent injury to the skin at the corners of the toenail. Do not cut your nails in a curved shape. Keep your feet clean and dry to help prevent infection.  Medicines Take over-the-counter and prescription medicines only as told by your health care provider. If you were prescribed an antibiotic, take it as told by your health care provider. Do not stop taking the antibiotic even if you start to feel better. Activity Return to your normal activities as told by your health care provider. Ask your health care provider what activities are safe for you. Avoid activities that cause pain. General instructions If your health care provider told you to use crutches to help you move around, use them as instructed. Keep all follow-up visits as told by your health care provider. This is important. Contact a health care provider if: You have more redness, swelling, pain, or   other symptoms that do not improve with treatment. You have fluid, blood, or pus coming from your toenail. Get help right away if: You have a red streak on your skin that starts at your foot and spreads up your leg. You have a fever. Summary An ingrown toenail occurs when the corner or sides of a toenail grow into the surrounding skin. This causes discomfort and pain. The big toe is most commonly  affected, but any of the toes can be affected. If an ingrown toenail is not treated, it can become infected. Fluid or pus draining from your toenail is a sign of infection. Your health care provider may need to drain it. You may be given antibiotics to treat the infection. Trimming your toenails regularly and properly can help you prevent an ingrown toenail. This information is not intended to replace advice given to you by your health care provider. Make sure you discuss any questions you have with your healthcare provider. Document Revised: 10/12/2018 Document Reviewed: 03/08/2017 Elsevier Patient Education  2021 [employer]  

## 2021-04-15 NOTE — Progress Notes (Signed)
Acute Office Visit  Subjective:    Patient ID: Benjamin Gill, male    DOB: 09-04-1960, 60 y.o.   MRN: [id number]  Chief Complaint  Patient presents with   Ingrown Toenail    HPId Patient is in today for left big toe infection x 4 weeks.  He has completed keflex and bactrim for this. He completed bactrim about 1 week ago. Since then, he has had increased erythema and warmth. He denies drainage. He denies fever or chills. He has also tried peroxide without improvement.   Past Medical History:  Diagnosis Date   Macular degeneration    left eye    Past Surgical History:  Procedure Laterality Date   COLONOSCOPY WITH PROPOFOL N/A 10/05/2020   Procedure: COLONOSCOPY WITH PROPOFOL;  Surgeon: Eloise Harman, DO;  Location: [facility];  Service: Endoscopy;  Laterality: N/A;  AM   POLYPECTOMY  10/05/2020   Procedure: POLYPECTOMY;  Surgeon: Eloise Harman, DO;  Location: [facility];  Service: Endoscopy;;   WRIST FRACTURE SURGERY Bilateral 2011    Family History  Problem Relation Age of Onset   Celiac disease Father    Colon cancer Neg Hx     Social History   Socioeconomic History   Marital status: Married    Spouse name: Not on file   Number of children: 2   Years of education: 12   Highest education level: High school graduate  Occupational History    Employer: [employer]  Tobacco Use   Smoking status: Former    Packs/day: 1.00    Years: 40.00    Pack years: 40.00    Types: Cigarettes    Quit date: 06/29/2018    Years since quitting: 2.7   Smokeless tobacco: Never  Vaping Use   Vaping Use: Never used  Substance and Sexual Activity   Alcohol use: Not Currently   Drug use: Not Currently   Sexual activity: Yes  Other Topics Concern   Not on file  Social History Narrative   Not on file   Social Determinants of Health   Financial Resource Strain: Not on file  Food Insecurity: Not on file  Transportation Needs: Not on file  Physical Activity: Not on file   Stress: Not on file  Social Connections: Not on file  Intimate Partner Violence: Not on file    Outpatient Medications Prior to Visit  Medication Sig Dispense Refill   cholecalciferol (VITAMIN D3) 25 MCG (1000 UNIT) tablet Take 1,000 Units by mouth daily.     Multiple Vitamins-Minerals (ICAPS AREDS 2 PO) Take 1 capsule by mouth daily.     zinc gluconate 50 MG tablet Take 50 mg by mouth daily.     polyethylene glycol-electrolytes (TRILYTE) 420 g solution Take 4,000 mLs by mouth as directed. 4000 mL 0   sulfamethoxazole-trimethoprim (BACTRIM DS) 800-160 MG tablet Take 1 tablet by mouth 2 (two) times daily. 14 tablet 0   No facility-administered medications prior to visit.    No Known Allergies  Review of Systems As per HPI.     Objective:    Physical Exam Vitals and nursing note reviewed.  Constitutional:      General: He is not in acute distress.    Appearance: He is not ill-appearing, toxic-appearing or diaphoretic.  Pulmonary:     Effort: Pulmonary effort is normal. No respiratory distress.  Feet:     Left foot:     Toenail Condition: Left toenails are ingrown.  Comments: Left great toe: ingrown, thickened nail with surrounding erythema, tenderness, and yellow crusting to medial and lower aspect of toe.  Neurological:     Mental Status: He is alert.  Psychiatric:        Mood and Affect: Mood normal.        Behavior: Behavior normal.    BP (!) 108/57   Pulse 80   Temp 97.7 F (36.5 C) (Temporal)   Ht 5\' 10"  (1.778 m)   Wt 181 lb 4 oz (82.2 kg)   BMI 26.01 kg/m  Wt Readings from Last 3 Encounters:  04/15/21 181 lb 4 oz (82.2 kg)  09/01/20 183 lb 12.8 oz (83.4 kg)  06/29/20 179 lb 4 oz (81.3 kg)    Health Maintenance Due  Topic Date Due   COVID-19 Vaccine (1) Never done   Zoster Vaccines- Shingrix (1 of 2) Never done   INFLUENZA VACCINE  Never done    There are no preventive care reminders to display for this patient.   Lab Results  Component Value  Date   TSH 1.680 06/29/2020   Lab Results  Component Value Date   WBC 10.0 06/29/2020   HGB 12.8 (L) 06/29/2020   HCT 38.7 06/29/2020   MCV 93 06/29/2020   PLT 271 06/29/2020   Lab Results  Component Value Date   NA 134 (L) 10/02/2020   K 3.8 10/02/2020   CO2 22 10/02/2020   GLUCOSE 111 (H) 10/02/2020   BUN 29 (H) 10/02/2020   CREATININE 1.14 10/02/2020   BILITOT <0.2 06/29/2020   ALKPHOS 115 06/29/2020   AST 25 06/29/2020   ALT 29 06/29/2020   PROT 6.4 06/29/2020   ALBUMIN 4.0 06/29/2020   CALCIUM 8.5 (L) 10/02/2020   ANIONGAP 8 10/02/2020   Lab Results  Component Value Date   CHOL 194 06/29/2020   Lab Results  Component Value Date   HDL 41 06/29/2020   Lab Results  Component Value Date   LDLCALC 83 06/29/2020   Lab Results  Component Value Date   TRIG 434 (H) 06/29/2020   Lab Results  Component Value Date   CHOLHDL 4.7 06/29/2020   No results found for: HGBA1C     Assessment & Plan:   Barrington was seen today for ingrown toenail.  Diagnoses and all orders for this visit:  Ingrown toenail with infection Has completed keflex and bactrim without resolution. Doxycycline as below. Discussed epsom salt oaks. Referral to podiatry placed to have ingrown toenail removed.  -     doxycycline (VIBRA-TABS) 100 MG tablet; Take 1 tablet (100 mg total) by mouth 2 (two) times daily for 7 days. 1 po bid -     Ambulatory referral to Podiatry  Return to office for new or worsening symptoms, or if symptoms persist.   The patient indicates understanding of these issues and agrees with the plan.  Gwenlyn Perking, FNP

## 2021-04-23 ENCOUNTER — Telehealth: Payer: Self-pay

## 2021-04-23 ENCOUNTER — Encounter: Payer: Self-pay | Admitting: *Deleted

## 2021-04-23 DIAGNOSIS — L600 Ingrowing nail: Secondary | ICD-10-CM

## 2021-04-23 MED ORDER — MUPIROCIN 2 % EX OINT: 1.0000 "application " | TOPICAL_OINTMENT | Freq: Three times a day (TID) | CUTANEOUS | 0 refills | Status: AC

## 2021-04-23 NOTE — Telephone Encounter (Signed)
Pt made aware

## 2021-04-23 NOTE — Telephone Encounter (Signed)
Seen Dr.Drake yesterday and he called mupirocin oint. But did not give direction. Toe is really hurting him. Dr. Irving Shows is out of the office until next week. Could we please send in Rx with instructions to [facility] in [place].

## 2021-04-23 NOTE — Telephone Encounter (Signed)
I have sent in Rx for the mupirocin oint. Apply TID for 10 days.

## 2021-06-09 ENCOUNTER — Encounter: Payer: Self-pay | Admitting: *Deleted

## 2021-06-30 ENCOUNTER — Ambulatory Visit: Payer: BC Managed Care – PPO | Admitting: Nurse Practitioner

## 2021-06-30 ENCOUNTER — Ambulatory Visit: Payer: BC Managed Care – PPO | Admitting: Family Medicine

## 2021-07-02 ENCOUNTER — Encounter: Payer: Self-pay | Admitting: Nurse Practitioner

## 2021-07-02 ENCOUNTER — Ambulatory Visit (INDEPENDENT_AMBULATORY_CARE_PROVIDER_SITE_OTHER): Payer: BC Managed Care – PPO | Admitting: Nurse Practitioner

## 2021-07-02 VITALS — BP 108/75 | HR 73 | Temp 97.6°F | Ht 70.0 in | Wt 179.0 lb

## 2021-07-02 DIAGNOSIS — Z136 Encounter for screening for cardiovascular disorders: Secondary | ICD-10-CM | POA: Diagnosis not present

## 2021-07-02 DIAGNOSIS — Z0000 Encounter for general adult medical examination without abnormal findings: Secondary | ICD-10-CM | POA: Diagnosis not present

## 2021-07-02 NOTE — Progress Notes (Signed)
Established Patient Office Visit  Subjective:  Patient ID: Benjamin Gill, male    DOB: 1960-12-05  Age: 60 y.o. MRN: [id number]  CC:  Chief Complaint  Patient presents with   Annual Exam    HPI Benjamin Gill presents for .   Encounter for general adult medical examination without abnormal findings  Physical: Patient's last physical exam was 1 year ago .  Weight: Appropriate for height (BMI less than 27%) ;  Blood Pressure: Normal (BP less than 120/80) ;  Medical History: Patient history reviewed ; Family history reviewed ;  Allergies Reviewed: No change in current allergies ;  Medications Reviewed: Medications reviewed - no changes ;  Lipids: Normal lipid levels ;  labs completed results pending Smoking: Life-long non-smoker  Physical Activity: Exercises at least 3 times per week ; as tolerated Alcohol/Drug Use: Is a non-drinker ; No illicit drug use ;  Patient is not afflicted from Stress Incontinence and Urge Incontinence  Safety: reviewed ; Patient wears a seat belt, has smoke detectors, has carbon monoxide detectors, practices appropriate gun safety, and wears sunscreen with extended sun exposure. Dental Care: biannual cleanings, brushes and flosses daily. Ophthalmology/Optometry: Annual visit.  Hearing loss: decreased hearing in left ear. Vision impairments: wears glasses  Past Medical History:  Diagnosis Date   Macular degeneration    left eye    Past Surgical History:  Procedure Laterality Date   COLONOSCOPY WITH PROPOFOL N/A 10/05/2020   Procedure: COLONOSCOPY WITH PROPOFOL;  Surgeon: Eloise Harman, DO;  [facility]: [facility];  Service: Endoscopy;  Laterality: N/A;  AM   POLYPECTOMY  10/05/2020   Procedure: POLYPECTOMY;  Surgeon: Eloise Harman, DO;  [facility];  Service: Endoscopy;;   WRIST FRACTURE SURGERY Bilateral 2011    Family History  Problem Relation Age of Onset   Celiac disease Father    Colon cancer Neg Hx     Social History    Socioeconomic History   Marital status: Married    Spouse name: Not on file   Number of children: 2   Years of education: 12   Highest education level: High school graduate  Occupational History    Employer: [employer]  Tobacco Use   Smoking status: Former    Packs/day: 1.00    Years: 40.00    Pack years: 40.00    Types: Cigarettes    Quit date: 06/29/2018    Years since quitting: 3.0   Smokeless tobacco: Never  Vaping Use   Vaping Use: Never used  Substance and Sexual Activity   Alcohol use: Not Currently   Drug use: Not Currently   Sexual activity: Yes  Other Topics Concern   Not on file  Social History Narrative   Not on file   Social Determinants of Health   Financial Resource Strain: Not on file  Food Insecurity: Not on file  Transportation Needs: Not on file  Physical Activity: Not on file  Stress: Not on file  Social Connections: Not on file  Intimate Partner Violence: Not on file    Outpatient Medications Prior to Visit  Medication Sig Dispense Refill   cholecalciferol (VITAMIN D3) 25 MCG (1000 UNIT) tablet Take 1,000 Units by mouth daily.     Multiple Vitamins-Minerals (ICAPS AREDS 2 PO) Take 1 capsule by mouth daily.     zinc gluconate 50 MG tablet Take 50 mg by mouth daily.     No facility-administered medications prior to visit.  No Known Allergies  ROS Review of Systems  Constitutional: Negative.   HENT: Negative.    Eyes: Negative.   Respiratory: Negative.    Cardiovascular: Negative.   Genitourinary: Negative.   Musculoskeletal: Negative.   Skin: Negative.   Neurological: Negative.   All other systems reviewed and are negative.    Objective:    Physical Exam Vitals and nursing note reviewed.  Constitutional:      Appearance: Normal appearance.  HENT:     Head: Normocephalic.     Right Ear: Ear canal and external ear normal.     Left Ear: Ear canal and external ear normal.     Nose: Nose normal. No congestion.      Mouth/Throat:     Mouth: Mucous membranes are moist.     Pharynx: Oropharynx is clear.  Eyes:     Conjunctiva/sclera: Conjunctivae normal.  Cardiovascular:     Rate and Rhythm: Normal rate and regular rhythm.     Pulses: Normal pulses.     Heart sounds: Normal heart sounds.  Pulmonary:     Effort: Pulmonary effort is normal.     Breath sounds: Normal breath sounds.  Abdominal:     General: Bowel sounds are normal.  Skin:    General: Skin is warm.     Findings: No rash.  Neurological:     Mental Status: He is alert and oriented to person, place, and time.  Psychiatric:        Mood and Affect: Mood normal.        Behavior: Behavior normal.    BP 108/75    Pulse 73    Temp 97.6 F (36.4 C)    Ht 5\' 10"  (1.778 m)    Wt 179 lb (81.2 kg)    SpO2 95%    BMI 25.68 kg/m  Wt Readings from Last 3 Encounters:  07/02/21 179 lb (81.2 kg)  04/15/21 181 lb 4 oz (82.2 kg)  09/01/20 183 lb 12.8 oz (83.4 kg)       Lab Results  Component Value Date   TSH 1.680 06/29/2020   Lab Results  Component Value Date   WBC 10.0 06/29/2020   HGB 12.8 (L) 06/29/2020   HCT 38.7 06/29/2020   MCV 93 06/29/2020   PLT 271 06/29/2020   Lab Results  Component Value Date   NA 134 (L) 10/02/2020   K 3.8 10/02/2020   CO2 22 10/02/2020   GLUCOSE 111 (H) 10/02/2020   BUN 29 (H) 10/02/2020   CREATININE 1.14 10/02/2020   BILITOT <0.2 06/29/2020   ALKPHOS 115 06/29/2020   AST 25 06/29/2020   ALT 29 06/29/2020   PROT 6.4 06/29/2020   ALBUMIN 4.0 06/29/2020   CALCIUM 8.5 (L) 10/02/2020   ANIONGAP 8 10/02/2020   Lab Results  Component Value Date   CHOL 194 06/29/2020   Lab Results  Component Value Date   HDL 41 06/29/2020   Lab Results  Component Value Date   LDLCALC 83 06/29/2020   Lab Results  Component Value Date   TRIG 434 (H) 06/29/2020   Lab Results  Component Value Date   CHOLHDL 4.7 06/29/2020   No results found for: HGBA1C    Assessment & Plan:   Problem List Items  Addressed This Visit       Other   Annual physical exam - Primary    Completed head to toe assessment. Patient has no new concerns, labs completed: CBC, CMP, Lipid panel. Results pending  Relevant Orders   CBC with Differential   Comprehensive metabolic panel   Lipid Panel    No orders of the defined types were placed in this encounter.   Follow-up: Return in about 1 year (around 07/02/2022).    Ivy Lynn, NP

## 2021-07-02 NOTE — Assessment & Plan Note (Signed)
Completed head to toe assessment. Patient has no new concerns, labs completed: CBC, CMP, Lipid panel. Results pending

## 2021-07-02 NOTE — Patient Instructions (Signed)

## 2021-07-03 LAB — CBC WITH DIFFERENTIAL/PLATELET
Basophils Absolute: 0.1 10*3/uL (ref 0.0–0.2)
Basos: 1 %
EOS (ABSOLUTE): 0.2 10*3/uL (ref 0.0–0.4)
Eos: 3 %
Hematocrit: 41.8 % (ref 37.5–51.0)
Hemoglobin: 14.2 g/dL (ref 13.0–17.7)
Immature Grans (Abs): 0 10*3/uL (ref 0.0–0.1)
Immature Granulocytes: 0 %
Lymphocytes Absolute: 2 10*3/uL (ref 0.7–3.1)
Lymphs: 27 %
MCH: 30.5 pg (ref 26.6–33.0)
MCHC: 34 g/dL (ref 31.5–35.7)
MCV: 90 fL (ref 79–97)
Monocytes Absolute: 0.7 10*3/uL (ref 0.1–0.9)
Monocytes: 9 %
Neutrophils Absolute: 4.5 10*3/uL (ref 1.4–7.0)
Neutrophils: 60 %
Platelets: 246 10*3/uL (ref 150–450)
RBC: 4.66 x10E6/uL (ref 4.14–5.80)
RDW: 11.7 % (ref 11.6–15.4)
WBC: 7.4 10*3/uL (ref 3.4–10.8)

## 2021-07-03 LAB — LIPID PANEL
Chol/HDL Ratio: 4.7 ratio (ref 0.0–5.0)
Cholesterol, Total: 249 mg/dL — ABNORMAL HIGH (ref 100–199)
HDL: 53 mg/dL (ref >39)
LDL Chol Calc (NIH): 162 mg/dL — ABNORMAL HIGH (ref 0–99)
Triglycerides: 188 mg/dL — ABNORMAL HIGH (ref 0–149)
VLDL Cholesterol Cal: 34 mg/dL (ref 5–40)

## 2021-07-03 LAB — COMPREHENSIVE METABOLIC PANEL
ALT: 33 IU/L (ref 0–44)
AST: 27 IU/L (ref 0–40)
Albumin/Globulin Ratio: 1.8 (ref 1.2–2.2)
Albumin: 4.4 g/dL (ref 3.8–4.9)
Alkaline Phosphatase: 125 IU/L — ABNORMAL HIGH (ref 44–121)
BUN/Creatinine Ratio: 17 (ref 10–24)
BUN: 19 mg/dL (ref 8–27)
Bilirubin Total: 0.2 mg/dL (ref 0.0–1.2)
CO2: 21 mmol/L (ref 20–29)
Calcium: 9.5 mg/dL (ref 8.6–10.2)
Chloride: 105 mmol/L (ref 96–106)
Creatinine, Ser: 1.11 mg/dL (ref 0.76–1.27)
Globulin, Total: 2.4 g/dL (ref 1.5–4.5)
Glucose: 94 mg/dL (ref 70–99)
Potassium: 4.9 mmol/L (ref 3.5–5.2)
Sodium: 141 mmol/L (ref 134–144)
Total Protein: 6.8 g/dL (ref 6.0–8.5)
eGFR: 76 mL/min/{1.73_m2} (ref >59)

## 2021-07-21 ENCOUNTER — Other Ambulatory Visit: Payer: Self-pay

## 2021-07-21 ENCOUNTER — Encounter: Payer: Self-pay | Admitting: Internal Medicine

## 2021-07-21 ENCOUNTER — Ambulatory Visit (INDEPENDENT_AMBULATORY_CARE_PROVIDER_SITE_OTHER): Payer: BC Managed Care – PPO | Admitting: Internal Medicine

## 2021-07-21 VITALS — BP 95/64 | HR 66 | Temp 97.5°F | Ht 70.0 in | Wt 182.4 lb

## 2021-07-21 DIAGNOSIS — K219 Gastro-esophageal reflux disease without esophagitis: Secondary | ICD-10-CM | POA: Diagnosis not present

## 2021-07-21 DIAGNOSIS — R1319 Other dysphagia: Secondary | ICD-10-CM | POA: Diagnosis not present

## 2021-07-21 DIAGNOSIS — D126 Benign neoplasm of colon, unspecified: Secondary | ICD-10-CM | POA: Diagnosis not present

## 2021-07-21 DIAGNOSIS — G8929 Other chronic pain: Secondary | ICD-10-CM

## 2021-07-21 DIAGNOSIS — R1011 Right upper quadrant pain: Secondary | ICD-10-CM

## 2021-07-21 NOTE — H&P (View-Only) (Signed)
Referring Provider: Gwenlyn Perking, FNP Primary Care Physician:  Gwenlyn Perking, FNP Primary GI:  Dr. Abbey Chatters  Chief Complaint  Patient presents with   Gas    Gas pain on right side    HPI:   Benjamin Gill is a 61 y.o. male who presents to clinic today for follow-up visit.  Has numerous complaints for me today.  Notes chronic right upper quadrant pain.  Believes it is gas pain.  Intermittent, moderate in severity at times.  Radiates to his left upper quadrant and sometimes his back.  Not associated with meals.  Also with chronic acid reflux and esophageal dysphagia.  Feels as though food will get stuck in his substernal region at times.  Occasionally has issues with liquids as well.  Will occasionally take over-the-counter antacids.  No change in bowel movements.  No melena hematochezia.  Underwent right upper quadrant ultrasound December 2021 which was unremarkable.  Colonoscopy April 2022 due to positive Cologuard with numerous tubular adenomas removed recommended 3-year recall  Past Medical History:  Diagnosis Date   Macular degeneration    left eye    Past Surgical History:  Procedure Laterality Date   COLONOSCOPY WITH PROPOFOL N/A 10/05/2020   Procedure: COLONOSCOPY WITH PROPOFOL;  Surgeon: Eloise Harman, DO;  [facility];  Service: Endoscopy;  Laterality: N/A;  AM   POLYPECTOMY  10/05/2020   Procedure: POLYPECTOMY;  Surgeon: Eloise Harman, DO;  [facility]: [facility];  Service: Endoscopy;;   WRIST FRACTURE SURGERY Bilateral 2011    Current Outpatient Medications  Medication Sig Dispense Refill   cholecalciferol (VITAMIN D3) 25 MCG (1000 UNIT) tablet Take 1,000 Units by mouth daily.     Multiple Vitamins-Minerals (ICAPS AREDS 2 PO) Take 1 capsule by mouth daily.     zinc gluconate 50 MG tablet Take 50 mg by mouth daily.     No current facility-administered medications for this visit.    Allergies as of 07/21/2021   (No Known Allergies)     Family History  Problem Relation Age of Onset   Celiac disease Father    Colon cancer Neg Hx     Social History   Socioeconomic History   Marital status: Married    Spouse name: Not on file   Number of children: 2   Years of education: 12   Highest education level: High school graduate  Occupational History    Employer: [employer]  Tobacco Use   Smoking status: Former    Packs/day: 1.00    Years: 40.00    Pack years: 40.00    Types: Cigarettes    Quit date: 06/29/2018    Years since quitting: 3.0   Smokeless tobacco: Never  Vaping Use   Vaping Use: Never used  Substance and Sexual Activity   Alcohol use: Not Currently   Drug use: Not Currently   Sexual activity: Yes  Other Topics Concern   Not on file  Social History Narrative   Not on file   Social Determinants of Health   Financial Resource Strain: Not on file  Food Insecurity: Not on file  Transportation Needs: Not on file  Physical Activity: Not on file  Stress: Not on file  Social Connections: Not on file    Subjective: Review of Systems  Constitutional:  Negative for chills and fever.  HENT:  Negative for congestion and hearing loss.   Eyes:  Negative for blurred vision and double vision.  Respiratory:  Negative for cough and shortness of breath.   Cardiovascular:  Negative for chest pain and palpitations.  Gastrointestinal:  Positive for abdominal pain and heartburn. Negative for blood in stool, constipation, diarrhea, melena and vomiting.       Dysphagia  Genitourinary:  Negative for dysuria and urgency.  Musculoskeletal:  Negative for joint pain and myalgias.  Skin:  Negative for itching and rash.  Neurological:  Negative for dizziness and headaches.  Psychiatric/Behavioral:  Negative for depression. The patient is not nervous/anxious.     Objective: BP 95/64    Pulse 66    Temp (!) 97.5 F (36.4 C) (Temporal)    Ht 5\' 10"  (1.778 m)    Wt 182 lb 6.4 oz (82.7 kg)    BMI 26.17 kg/m   Physical Exam Constitutional:      Appearance: Normal appearance.  HENT:     Head: Normocephalic and atraumatic.  Eyes:     Extraocular Movements: Extraocular movements intact.     Conjunctiva/sclera: Conjunctivae normal.  Cardiovascular:     Rate and Rhythm: Normal rate and regular rhythm.  Pulmonary:     Effort: Pulmonary effort is normal.     Breath sounds: Normal breath sounds.  Abdominal:     General: Bowel sounds are normal.     Palpations: Abdomen is soft.  Musculoskeletal:        General: Normal range of motion.     Cervical back: Normal range of motion and neck supple.  Skin:    General: Skin is warm.  Neurological:     General: No focal deficit present.     Mental Status: He is alert and oriented to person, place, and time.  Psychiatric:        Mood and Affect: Mood normal.        Behavior: Behavior normal.     Assessment: *Right upper quadrant abdominal pain-chronic, intermittent *GERD *Dysphagia *Adenomatous colon polyps  Plan: Will schedule for EGD with possible dilation to evaluate for peptic ulcer disease, esophagitis, gastritis, H. Pylori, duodenitis, or other. Will also evaluate for esophageal stricture, Schatzki's ring, esophageal web or other.   The risks including infection, bleed, or perforation as well as benefits, limitations, alternatives and imponderables have been reviewed with the patient. Potential for esophageal dilation, biopsy, etc. have also been reviewed.  Questions have been answered. All parties agreeable.  May need to consider PPI therapy pending endoscopic findings.  Colonoscopy recall April 2025 due to adenomatous colon polyps.  07/21/2021 4:20 PM   Disclaimer: This note was dictated with voice recognition software. Similar sounding words can inadvertently be transcribed and may not be corrected upon review.

## 2021-07-21 NOTE — Progress Notes (Signed)
Referring Provider: Gwenlyn Perking, FNP Primary Care Physician:  Gwenlyn Perking, FNP Primary GI:  Dr. Abbey Chatters  Chief Complaint  Patient presents with   Gas    Gas pain on right side    HPI:   Benjamin Gill is a 61 y.o. male who presents to clinic today for follow-up visit.  Has numerous complaints for me today.  Notes chronic right upper quadrant pain.  Believes it is gas pain.  Intermittent, moderate in severity at times.  Radiates to his left upper quadrant and sometimes his back.  Not associated with meals.  Also with chronic acid reflux and esophageal dysphagia.  Feels as though food will get stuck in his substernal region at times.  Occasionally has issues with liquids as well.  Will occasionally take over-the-counter antacids.  No change in bowel movements.  No melena hematochezia.  Underwent right upper quadrant ultrasound December 2021 which was unremarkable.  Colonoscopy April 2022 due to positive Cologuard with numerous tubular adenomas removed recommended 3-year recall  Past Medical History:  Diagnosis Date   Macular degeneration    left eye    Past Surgical History:  Procedure Laterality Date   COLONOSCOPY WITH PROPOFOL N/A 10/05/2020   Procedure: COLONOSCOPY WITH PROPOFOL;  Surgeon: Eloise Harman, DO;  [facility];  Service: Endoscopy;  Laterality: N/A;  AM   POLYPECTOMY  10/05/2020   Procedure: POLYPECTOMY;  Surgeon: Eloise Harman, DO;  [facility]: [facility];  Service: Endoscopy;;   WRIST FRACTURE SURGERY Bilateral 2011    Current Outpatient Medications  Medication Sig Dispense Refill   cholecalciferol (VITAMIN D3) 25 MCG (1000 UNIT) tablet Take 1,000 Units by mouth daily.     Multiple Vitamins-Minerals (ICAPS AREDS 2 PO) Take 1 capsule by mouth daily.     zinc gluconate 50 MG tablet Take 50 mg by mouth daily.     No current facility-administered medications for this visit.    Allergies as of 07/21/2021   (No Known Allergies)     Family History  Problem Relation Age of Onset   Celiac disease Father    Colon cancer Neg Hx     Social History   Socioeconomic History   Marital status: Married    Spouse name: Not on file   Number of children: 2   Years of education: 12   Highest education level: High school graduate  Occupational History    Employer: [employer]  Tobacco Use   Smoking status: Former    Packs/day: 1.00    Years: 40.00    Pack years: 40.00    Types: Cigarettes    Quit date: 06/29/2018    Years since quitting: 3.0   Smokeless tobacco: Never  Vaping Use   Vaping Use: Never used  Substance and Sexual Activity   Alcohol use: Not Currently   Drug use: Not Currently   Sexual activity: Yes  Other Topics Concern   Not on file  Social History Narrative   Not on file   Social Determinants of Health   Financial Resource Strain: Not on file  Food Insecurity: Not on file  Transportation Needs: Not on file  Physical Activity: Not on file  Stress: Not on file  Social Connections: Not on file    Subjective: Review of Systems  Constitutional:  Negative for chills and fever.  HENT:  Negative for congestion and hearing loss.   Eyes:  Negative for blurred vision and double vision.  Respiratory:  Negative for cough and shortness of breath.   Cardiovascular:  Negative for chest pain and palpitations.  Gastrointestinal:  Positive for abdominal pain and heartburn. Negative for blood in stool, constipation, diarrhea, melena and vomiting.       Dysphagia  Genitourinary:  Negative for dysuria and urgency.  Musculoskeletal:  Negative for joint pain and myalgias.  Skin:  Negative for itching and rash.  Neurological:  Negative for dizziness and headaches.  Psychiatric/Behavioral:  Negative for depression. The patient is not nervous/anxious.     Objective: BP 95/64    Pulse 66    Temp (!) 97.5 F (36.4 C) (Temporal)    Ht 5\' 10"  (1.778 m)    Wt 182 lb 6.4 oz (82.7 kg)    BMI 26.17 kg/m   Physical Exam Constitutional:      Appearance: Normal appearance.  HENT:     Head: Normocephalic and atraumatic.  Eyes:     Extraocular Movements: Extraocular movements intact.     Conjunctiva/sclera: Conjunctivae normal.  Cardiovascular:     Rate and Rhythm: Normal rate and regular rhythm.  Pulmonary:     Effort: Pulmonary effort is normal.     Breath sounds: Normal breath sounds.  Abdominal:     General: Bowel sounds are normal.     Palpations: Abdomen is soft.  Musculoskeletal:        General: Normal range of motion.     Cervical back: Normal range of motion and neck supple.  Skin:    General: Skin is warm.  Neurological:     General: No focal deficit present.     Mental Status: He is alert and oriented to person, place, and time.  Psychiatric:        Mood and Affect: Mood normal.        Behavior: Behavior normal.     Assessment: *Right upper quadrant abdominal pain-chronic, intermittent *GERD *Dysphagia *Adenomatous colon polyps  Plan: Will schedule for EGD with possible dilation to evaluate for peptic ulcer disease, esophagitis, gastritis, H. Pylori, duodenitis, or other. Will also evaluate for esophageal stricture, Schatzki's ring, esophageal web or other.   The risks including infection, bleed, or perforation as well as benefits, limitations, alternatives and imponderables have been reviewed with the patient. Potential for esophageal dilation, biopsy, etc. have also been reviewed.  Questions have been answered. All parties agreeable.  May need to consider PPI therapy pending endoscopic findings.  Colonoscopy recall April 2025 due to adenomatous colon polyps.  07/21/2021 4:20 PM   Disclaimer: This note was dictated with voice recognition software. Similar sounding words can inadvertently be transcribed and may not be corrected upon review.

## 2021-07-21 NOTE — Patient Instructions (Signed)
We will schedule you for upper endoscopy with possible esophageal dilation to further evaluate your abdominal pain, acid reflux, difficulty swallowing.  We may need to start you on a medication pending endoscopic findings.  Further recommendations to follow.  It was nice seeing you again today.  Dr. Abbey Chatters  At [facility] we value your feedback. You may receive a survey about your visit today. Please share your experience as we strive to create trusting relationships with our patients to provide genuine, compassionate, quality care.  We appreciate your understanding and patience as we review any laboratory studies, imaging, and other diagnostic tests that are ordered as we care for you. Our office policy is 5 business days for review of these results, and any emergent or urgent results are addressed in a timely manner for your best interest. If you do not hear from our office in 1 week, please contact us.   We also encourage the use of MyChart, which contains your medical information for your review as well. If you are not enrolled in this feature, an access code is on this after visit summary for your convenience. Thank you for allowing [place] to be involved in your care.  It was great to see you today!  I hope you have a great rest of your Winter!    Benjamin Gill. Abbey Chatters, D.O. Gastroenterology and Hepatology [facility]

## 2021-08-02 ENCOUNTER — Telehealth: Payer: Self-pay

## 2021-08-02 NOTE — Telephone Encounter (Signed)
Tried to call pt to see if he can arrive earlier tomorrow for EGD, left message on home and mobile numbers.

## 2021-08-02 NOTE — Telephone Encounter (Signed)
Spoke to pt, EGD for tomorrow moved up to 1:30pm. Advised him to arrive at 12:00pm and NPO after 9:00am tomorrow morning. Endo scheduler informed.

## 2021-08-03 ENCOUNTER — Ambulatory Visit (HOSPITAL_COMMUNITY): Payer: BC Managed Care – PPO | Admitting: Anesthesiology

## 2021-08-03 ENCOUNTER — Other Ambulatory Visit: Payer: Self-pay

## 2021-08-03 ENCOUNTER — Ambulatory Visit (HOSPITAL_COMMUNITY)
Admission: RE | Admit: 2021-08-03 | Discharge: 2021-08-03 | Disposition: A | Discharge status: Home / Self Care | Payer: BC Managed Care – PPO | Attending: Internal Medicine | Admitting: Internal Medicine

## 2021-08-03 ENCOUNTER — Encounter (HOSPITAL_COMMUNITY): Payer: Self-pay

## 2021-08-03 ENCOUNTER — Telehealth: Payer: Self-pay

## 2021-08-03 ENCOUNTER — Encounter (HOSPITAL_COMMUNITY)
Admission: RE | Disposition: A | Discharge status: Home / Self Care | Payer: Self-pay | Source: Home / Self Care | Attending: Internal Medicine

## 2021-08-03 DIAGNOSIS — K219 Gastro-esophageal reflux disease without esophagitis: Secondary | ICD-10-CM | POA: Insufficient documentation

## 2021-08-03 DIAGNOSIS — R12 Heartburn: Secondary | ICD-10-CM | POA: Diagnosis not present

## 2021-08-03 DIAGNOSIS — K2081 Other esophagitis with bleeding: Secondary | ICD-10-CM | POA: Diagnosis not present

## 2021-08-03 DIAGNOSIS — Z87891 Personal history of nicotine dependence: Secondary | ICD-10-CM | POA: Diagnosis not present

## 2021-08-03 DIAGNOSIS — K3189 Other diseases of stomach and duodenum: Secondary | ICD-10-CM | POA: Insufficient documentation

## 2021-08-03 DIAGNOSIS — R1011 Right upper quadrant pain: Secondary | ICD-10-CM | POA: Diagnosis not present

## 2021-08-03 DIAGNOSIS — K449 Diaphragmatic hernia without obstruction or gangrene: Secondary | ICD-10-CM | POA: Diagnosis not present

## 2021-08-03 DIAGNOSIS — K2210 Ulcer of esophagus without bleeding: Secondary | ICD-10-CM | POA: Insufficient documentation

## 2021-08-03 DIAGNOSIS — K2970 Gastritis, unspecified, without bleeding: Secondary | ICD-10-CM

## 2021-08-03 DIAGNOSIS — Z86010 Personal history of colonic polyps: Secondary | ICD-10-CM | POA: Insufficient documentation

## 2021-08-03 DIAGNOSIS — R1310 Dysphagia, unspecified: Secondary | ICD-10-CM | POA: Diagnosis not present

## 2021-08-03 DIAGNOSIS — R1314 Dysphagia, pharyngoesophageal phase: Secondary | ICD-10-CM | POA: Insufficient documentation

## 2021-08-03 DIAGNOSIS — K2289 Other specified disease of esophagus: Secondary | ICD-10-CM | POA: Insufficient documentation

## 2021-08-03 DIAGNOSIS — K2950 Unspecified chronic gastritis without bleeding: Secondary | ICD-10-CM | POA: Diagnosis not present

## 2021-08-03 DIAGNOSIS — R141 Gas pain: Secondary | ICD-10-CM | POA: Diagnosis not present

## 2021-08-03 HISTORY — PX: ESOPHAGEAL BRUSHING: SHX6842

## 2021-08-03 HISTORY — PX: BIOPSY: SHX5522

## 2021-08-03 HISTORY — PX: ESOPHAGOGASTRODUODENOSCOPY (EGD) WITH PROPOFOL: SHX5813

## 2021-08-03 LAB — KOH PREP: KOH Prep: NONE SEEN

## 2021-08-03 SURGERY — ESOPHAGOGASTRODUODENOSCOPY (EGD) WITH PROPOFOL
Anesthesia: General

## 2021-08-03 MED ORDER — PROPOFOL 10 MG/ML IV BOLUS
INTRAVENOUS | Status: DC | PRN
  Administered 2021-08-03: 40 mg via INTRAVENOUS
  Administered 2021-08-03: 130 mg via INTRAVENOUS

## 2021-08-03 MED ORDER — OMEPRAZOLE 40 MG PO CPDR: 40.0000 mg | DELAYED_RELEASE_CAPSULE | Freq: Two times a day (BID) | ORAL | 5 refills | Status: DC

## 2021-08-03 MED ORDER — LACTATED RINGERS IV SOLN: INTRAVENOUS | Status: DC

## 2021-08-03 MED ORDER — LIDOCAINE HCL (CARDIAC) PF 100 MG/5ML IV SOSY
PREFILLED_SYRINGE | INTRAVENOUS | Status: DC | PRN
  Administered 2021-08-03: 100 mg via INTRAVENOUS

## 2021-08-03 MED ORDER — SUCRALFATE 1 G PO TABS: 1.0000 g | ORAL_TABLET | Freq: Four times a day (QID) | ORAL | 1 refills | Status: DC

## 2021-08-03 NOTE — Anesthesia Preprocedure Evaluation (Signed)
Anesthesia Evaluation  Patient identified by MRN, date of birth, ID band Patient awake    Reviewed: Allergy & Precautions, NPO status , Patient's Chart, lab work & pertinent test results  Airway Mallampati: II  TM Distance: >3 FB Neck ROM: Full    Dental  (+) Dental Advisory Given, Loose,    Pulmonary former smoker,    Pulmonary exam normal breath sounds clear to auscultation       Cardiovascular negative cardio ROS Normal cardiovascular exam Rhythm:Regular Rate:Normal     Neuro/Psych negative neurological ROS  negative psych ROS   GI/Hepatic negative GI ROS, Neg liver ROS,   Endo/Other  negative endocrine ROS  Renal/GU negative Renal ROS  negative genitourinary   Musculoskeletal negative musculoskeletal ROS (+)   Abdominal   Peds negative pediatric ROS (+)  Hematology negative hematology ROS (+)   Anesthesia Other Findings   Reproductive/Obstetrics negative OB ROS                             Anesthesia Physical Anesthesia Plan  ASA: 2  Anesthesia Plan: General   Post-op Pain Management: Minimal or no pain anticipated   Induction:   PONV Risk Score and Plan: TIVA  Airway Management Planned: Nasal Cannula and Natural Airway  Additional Equipment:   Intra-op Plan:   Post-operative Plan:   Informed Consent: I have reviewed the patients History and Physical, chart, labs and discussed the procedure including the risks, benefits and alternatives for the proposed anesthesia with the patient or authorized representative who has indicated his/her understanding and acceptance.     Dental advisory given  Plan Discussed with: CRNA and Surgeon  Anesthesia Plan Comments:         Anesthesia Quick Evaluation

## 2021-08-03 NOTE — Anesthesia Procedure Notes (Signed)
Date/Time: 08/03/2021 1:23 PM Performed by: Orlie Dakin, CRNA Pre-anesthesia Checklist: Patient identified, Emergency Drugs available, Suction available and Patient being monitored Patient Re-evaluated:Patient Re-evaluated prior to induction Oxygen Delivery Method: Nasal cannula Induction Type: IV induction Placement Confirmation: positive ETCO2

## 2021-08-03 NOTE — Op Note (Signed)
[facility] Patient Name: Benjamin Gill Procedure Date: 08/03/2021 1:11 PM MRN: [id number] Date of Birth: 17-May-1961 Attending MD: Elon Alas. Abbey Chatters DO CSN: [id number] Age: 61 Admit Type: Outpatient Procedure:                Upper GI endoscopy Indications:              Dysphagia, Heartburn Providers:                Elon Alas. Abbey Chatters, DO, Caprice Kluver, Aram Candela Referring MD:              Medicines:                See the Anesthesia note for documentation of the                            administered medications Complications:            No immediate complications. Estimated Blood Loss:     Estimated blood loss was minimal. Procedure:                Pre-Anesthesia Assessment:                           - The anesthesia plan was to use monitored                            anesthesia care (MAC).                           After obtaining informed consent, the endoscope was                            passed under direct vision. Throughout the                            procedure, the patient's blood pressure, pulse, and                            oxygen saturations were monitored continuously. The                            GIF-H190 ([id number]) scope was introduced through the                            mouth, and advanced to the second part of duodenum.                            The upper GI endoscopy was accomplished without                            difficulty. The patient tolerated the procedure                            well. Scope In: 1:22:07 PM Scope Out: 1:28:02 PM Total Procedure Duration: 0 hours 5 minutes 55 seconds  Findings:      LA Grade D (one or more mucosal breaks involving at  least 75% of       esophageal circumference) esophagitis was found 31 to 36 cm from the       incisors. Very friable, spontaneous oozing after advancing endoscope.       Cells for cytology were obtained by brushing.      Mucosal changes characterized by nodularity were found at the        gastroesophageal junction. ? nodular Barretts. Biopsies were taken with       a cold forceps for histology.      Diffuse moderate inflammation characterized by erosions and erythema was       found in the entire examined stomach. Biopsies were taken with a cold       forceps for Helicobacter pylori testing.      Localized erythematous mucosa without active bleeding and with no       stigmata of bleeding was found in the first portion of the duodenum.      A 4 cm hiatal hernia was present from 36-40cm. Impression:               - LA Grade D erosive esophagitis with bleeding.                            Cells for cytology obtained.                           - Nodular mucosa in the esophagus. Biopsied.                           - Gastritis. Biopsied.                           - Erythematous duodenopathy.                           - 4 cm hiatal hernia. Moderate Sedation:      Per Anesthesia Care Recommendation:           - Patient has a contact number available for                            emergencies. The signs and symptoms of potential                            delayed complications were discussed with the                            patient. Return to normal activities tomorrow.                            Written discharge instructions were provided to the                            patient.                           - Resume previous diet.                           -  Continue present medications.                           - Await pathology results.                           - Repeat upper endoscopy in 10-12 weeks to evaluate                            the response to therapy and biopsy for possible                            Barretts.                           - Use Prilosec (omeprazole) 40 mg PO BID.                           - Use sucralfate suspension 1 gram PO QID. Procedure Code(s):        --- Professional ---                           [phone], Esophagogastroduodenoscopy, flexible,                             transoral; with biopsy, single or multiple Diagnosis Code(s):        --- Professional ---                           K20.81, Other esophagitis with bleeding                           K22.8, Other specified diseases of esophagus                           K29.70, Gastritis, unspecified, without bleeding                           K31.89, Other diseases of stomach and duodenum                           K44.9, Diaphragmatic hernia without obstruction or                            gangrene                           R13.10, Dysphagia, unspecified                           R12, Heartburn CPT copyright 2019 American Medical Association. All rights reserved. The codes documented in this report are preliminary and upon coder review may  be revised to meet current compliance requirements. Elon Alas. Abbey Chatters, DO [place] Abbey Chatters, DO 08/03/2021 1:34:03 PM This report has been signed electronically. Number of Addenda: 0

## 2021-08-03 NOTE — Discharge Instructions (Addendum)
EGD Discharge instructions Please read the instructions outlined below and refer to this sheet in the next few weeks. These discharge instructions provide you with general information on caring for yourself after you leave the hospital. Your doctor may also give you specific instructions. While your treatment has been planned according to the most current medical practices available, unavoidable complications occasionally occur. If you have any problems or questions after discharge, please call your doctor. ACTIVITY You may resume your regular activity but move at a slower pace for the next 24 hours.  Take frequent rest periods for the next 24 hours.  Walking will help expel (get rid of) the air and reduce the bloated feeling in your abdomen.  No driving for 24 hours (because of the anesthesia (medicine) used during the test).  You may shower.  Do not sign any important legal documents or operate any machinery for 24 hours (because of the anesthesia used during the test).  NUTRITION Drink plenty of fluids.  You may resume your normal diet.  Begin with a light meal and progress to your normal diet.  Avoid alcoholic beverages for 24 hours or as instructed by your caregiver.  MEDICATIONS You may resume your normal medications unless your caregiver tells you otherwise.  WHAT YOU CAN EXPECT TODAY You may experience abdominal discomfort such as a feeling of fullness or gas pains.  FOLLOW-UP Your doctor will discuss the results of your test with you.  SEEK IMMEDIATE MEDICAL ATTENTION IF ANY OF THE FOLLOWING OCCUR: Excessive nausea (feeling sick to your stomach) and/or vomiting.  Severe abdominal pain and distention (swelling).  Trouble swallowing.  Temperature over 101 F (37.8 C).  Rectal bleeding or vomiting of blood.   You have a significant amount of inflammation in your esophagus stomach and small bowel.  You have severe esophagitis which I took samples of today.  Also with a medium size  hiatal hernia.  I am going to start you on a new medication called omeprazole 40 mg twice daily.  This medication works best if you take it 30 minutes before breakfast and 30 minutes before dinner.  Also will send in medication called Carafate to take up to 4 times a day.  This will probably work best if you crush it and mix it with a small amount of water.  We will need to repeat EGD in 12 weeks to evaluate healing.  I may be able to dilate you at that time if still having issues.   I hope you have a great rest of your week!  Elon Alas. Abbey Chatters, D.O. Gastroenterology and Hepatology [facility]  PATIENT INSTRUCTIONS POST-ANESTHESIA  IMMEDIATELY FOLLOWING SURGERY:  Do not drive or operate machinery for the first twenty four hours after surgery.  Do not make any important decisions for twenty four hours after surgery or while taking narcotic pain medications or sedatives.  If you develop intractable nausea and vomiting or a severe headache please notify your doctor immediately.  FOLLOW-UP:  Please make an appointment with your surgeon as instructed. You do not need to follow up with anesthesia unless specifically instructed to do so.  WOUND CARE INSTRUCTIONS (if applicable):  Keep a dry clean dressing on the anesthesia/puncture wound site if there is drainage.  Once the wound has quit draining you may leave it open to air.  Generally you should leave the bandage intact for twenty four hours unless there is drainage.  If the epidural site drains for more than 36-48 hours please  call the anesthesia department.  QUESTIONS?:  Please feel free to call your physician or the hospital operator if you have any questions, and they will be happy to assist you.

## 2021-08-03 NOTE — Telephone Encounter (Signed)
90 day prescription request for sucralfate 1 gm tablets to be sent to [facility] [address] Last seen by Dr. Abbey Chatters on 07/21/21.

## 2021-08-03 NOTE — Interval H&P Note (Signed)
History and Physical Interval Note:  08/03/2021 12:49 PM  Benjamin Gill  has presented today for surgery, with the diagnosis of GERD, dysphagia.  The various methods of treatment have been discussed with the patient and family. After consideration of risks, benefits and other options for treatment, the patient has consented to  Procedure(s) with comments: ESOPHAGOGASTRODUODENOSCOPY (EGD) WITH PROPOFOL (N/A) - 3:00pm BALLOON DILATION (N/A) as a surgical intervention.  The patient's history has been reviewed, patient examined, no change in status, stable for surgery.  I have reviewed the patient's chart and labs.  Questions were answered to the patient's satisfaction.     Eloise Harman

## 2021-08-03 NOTE — Transfer of Care (Signed)
Immediate Anesthesia Transfer of Care Note  Patient: Benjamin Gill  Procedure(s) Performed: ESOPHAGOGASTRODUODENOSCOPY (EGD) WITH PROPOFOL BIOPSY ESOPHAGEAL BRUSHING  Patient Location: Endoscopy Unit  Anesthesia Type:General  Level of Consciousness: awake  Airway & Oxygen Therapy: Patient Spontanous Breathing  Post-op Assessment: Report given to RN and Post -op Vital signs reviewed and stable  Post vital signs: Reviewed and stable  Last Vitals:  Vitals Value Taken Time  BP    Temp    Pulse    Resp    SpO2      Last Pain:  Vitals:   08/03/21 1318  TempSrc:   PainSc: 0-No pain      Patients Stated Pain Goal: 6 (93/59/40 9050)  Complications: No notable events documented.

## 2021-08-03 NOTE — Anesthesia Postprocedure Evaluation (Signed)
Anesthesia Post Note  Patient: Benjamin Gill  Procedure(s) Performed: ESOPHAGOGASTRODUODENOSCOPY (EGD) WITH PROPOFOL BIOPSY ESOPHAGEAL BRUSHING  Patient location during evaluation: Endoscopy Anesthesia Type: General Level of consciousness: awake and alert and oriented Pain management: pain level controlled Vital Signs Assessment: post-procedure vital signs reviewed and stable Respiratory status: spontaneous breathing, nonlabored ventilation and respiratory function stable Cardiovascular status: blood pressure returned to baseline and stable Postop Assessment: no apparent nausea or vomiting Anesthetic complications: no   No notable events documented.   Last Vitals:  Vitals:   08/03/21 1206 08/03/21 1333  BP: 109/68 (!) 96/57  Pulse:  72  Resp: 15 12  Temp: 36.5 C 36.5 C  SpO2: 95% 96%    Last Pain:  Vitals:   08/03/21 1333  TempSrc: Axillary  PainSc:                  [name] [name]

## 2021-08-04 LAB — SURGICAL PATHOLOGY

## 2021-08-04 MED ORDER — SUCRALFATE 1 G PO TABS: 1.0000 g | ORAL_TABLET | Freq: Four times a day (QID) | ORAL | 3 refills | Status: DC

## 2021-08-04 NOTE — Telephone Encounter (Signed)
Completed.

## 2021-08-04 NOTE — Addendum Note (Signed)
Addended by: Annitta Needs on: 08/04/2021 12:00 PM   Modules accepted: Orders

## 2021-08-05 ENCOUNTER — Encounter (HOSPITAL_COMMUNITY): Payer: Self-pay | Admitting: Internal Medicine

## 2021-10-25 ENCOUNTER — Encounter: Payer: Self-pay | Admitting: Internal Medicine

## 2021-10-28 DIAGNOSIS — D225 Melanocytic nevi of trunk: Secondary | ICD-10-CM | POA: Diagnosis not present

## 2021-10-28 DIAGNOSIS — L570 Actinic keratosis: Secondary | ICD-10-CM | POA: Diagnosis not present

## 2021-10-28 DIAGNOSIS — L578 Other skin changes due to chronic exposure to nonionizing radiation: Secondary | ICD-10-CM | POA: Diagnosis not present

## 2021-10-28 DIAGNOSIS — L814 Other melanin hyperpigmentation: Secondary | ICD-10-CM | POA: Diagnosis not present

## 2022-02-17 DIAGNOSIS — D1801 Hemangioma of skin and subcutaneous tissue: Secondary | ICD-10-CM | POA: Diagnosis not present

## 2022-02-17 DIAGNOSIS — L570 Actinic keratosis: Secondary | ICD-10-CM | POA: Diagnosis not present

## 2022-02-28 ENCOUNTER — Other Ambulatory Visit: Payer: Self-pay | Admitting: Internal Medicine

## 2022-07-21 IMAGING — US US ABDOMEN LIMITED RUQ/ASCITES
1 series · 14 of 25 positions shown · non-contrast
Comparison: None.

CLINICAL DATA: 59-year-old male with right upper quadrant abdominal
pain.

EXAM:
ULTRASOUND ABDOMEN LIMITED RIGHT UPPER QUADRANT

[Series 1: us abdomen limited ruq (liver/gb) · 14 of 47 slices shown]
[im 1/47]
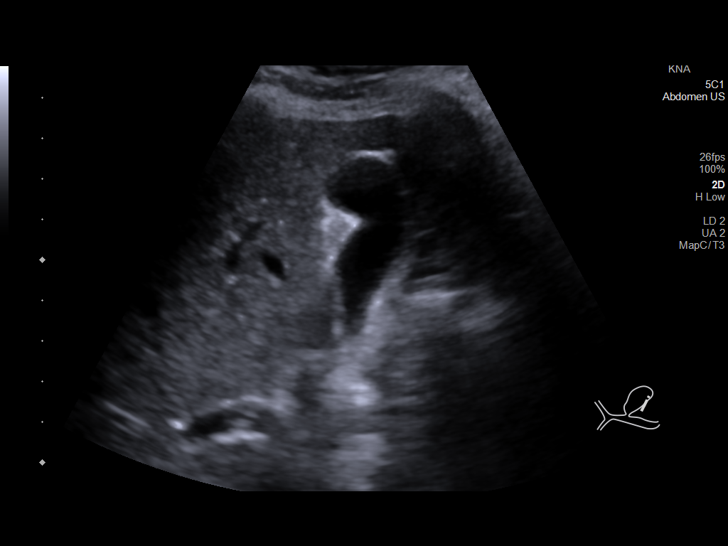
[im 4/47]
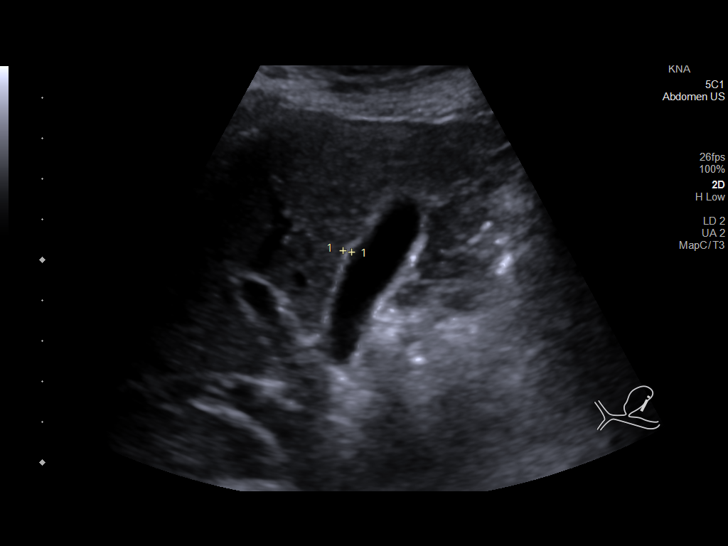
[im 8/47]
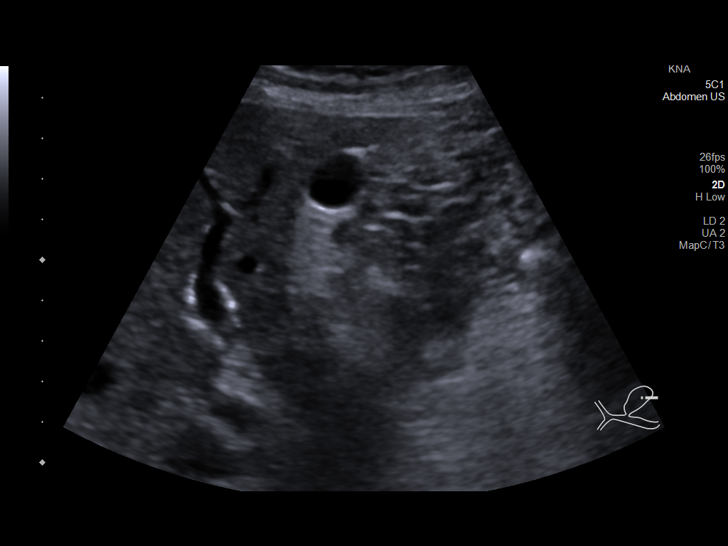
[im 12/47]
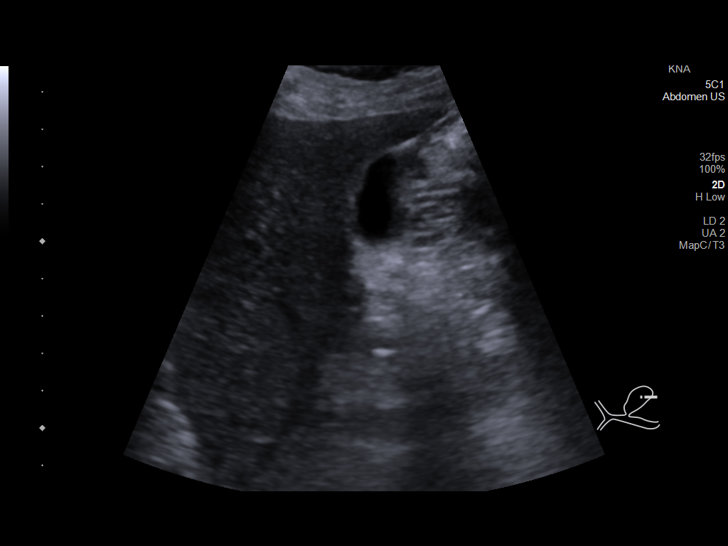
[im 16/47]
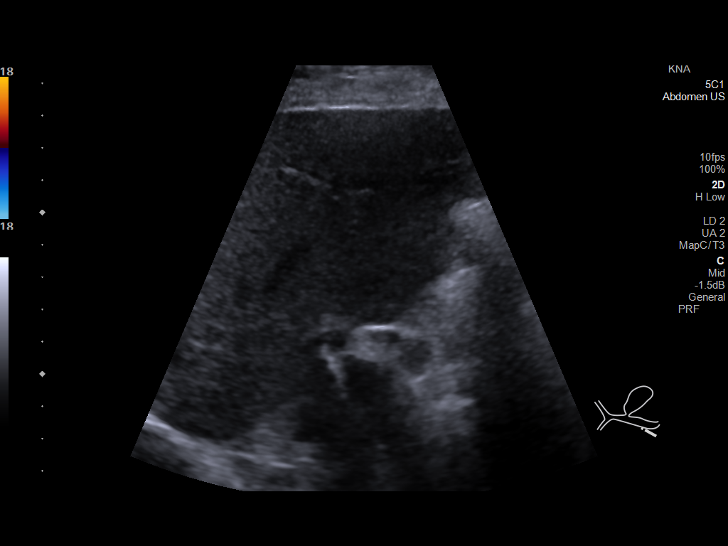
[im 18/47]
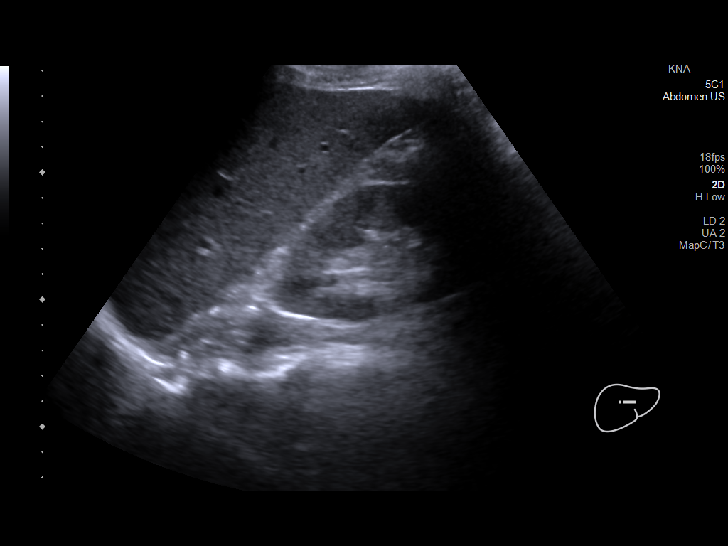
[im 22/47]
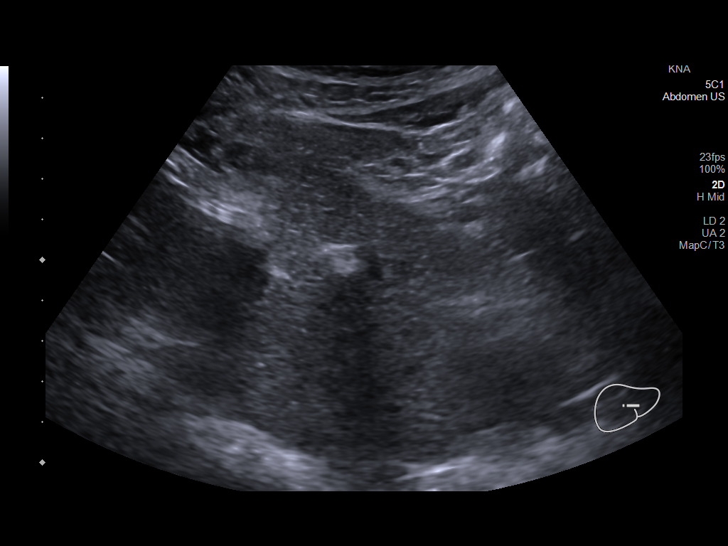
[im 25/47]
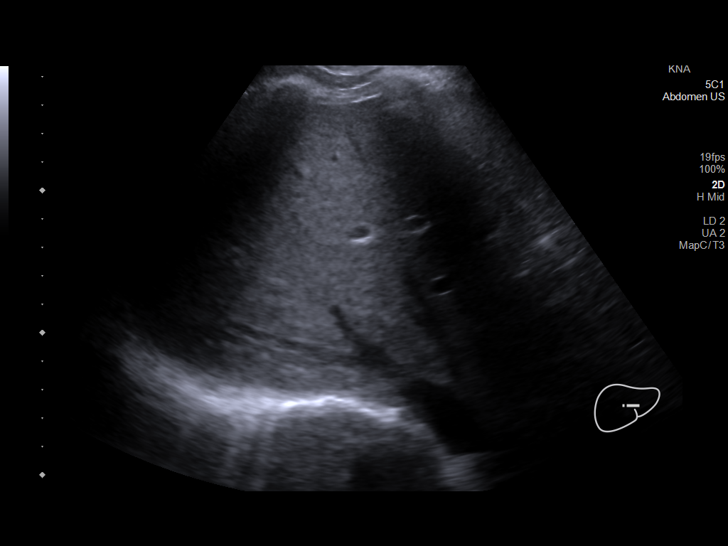
[im 29/47]
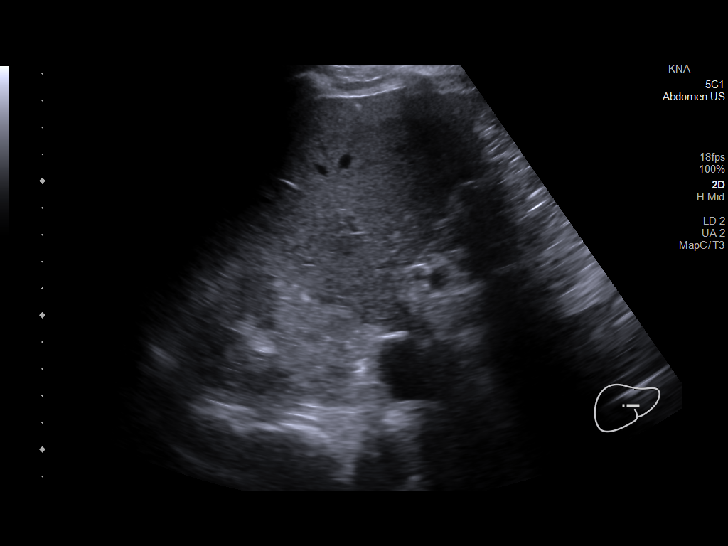
[im 31/47]
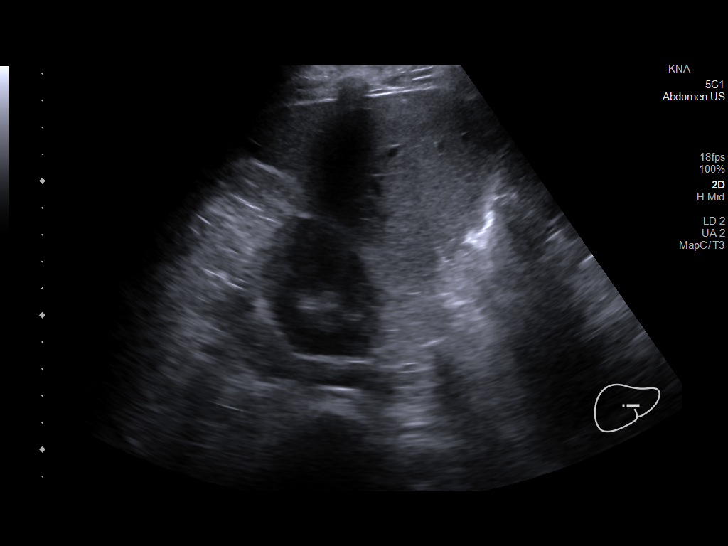
[im 35/47]
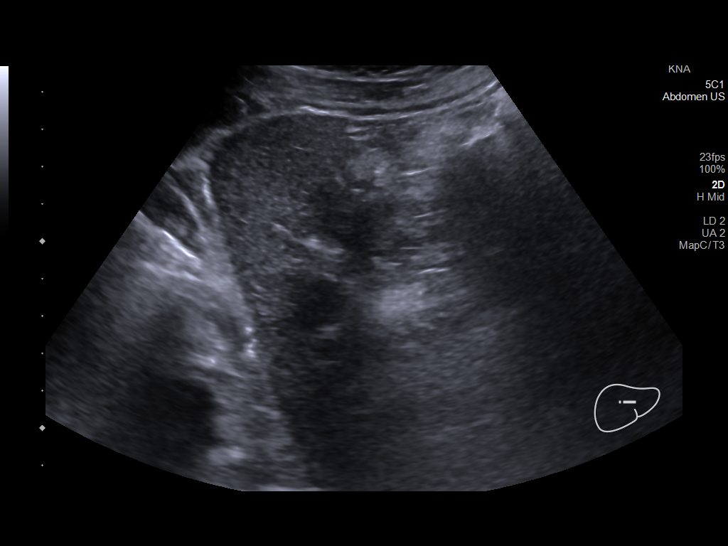
[im 39/47]
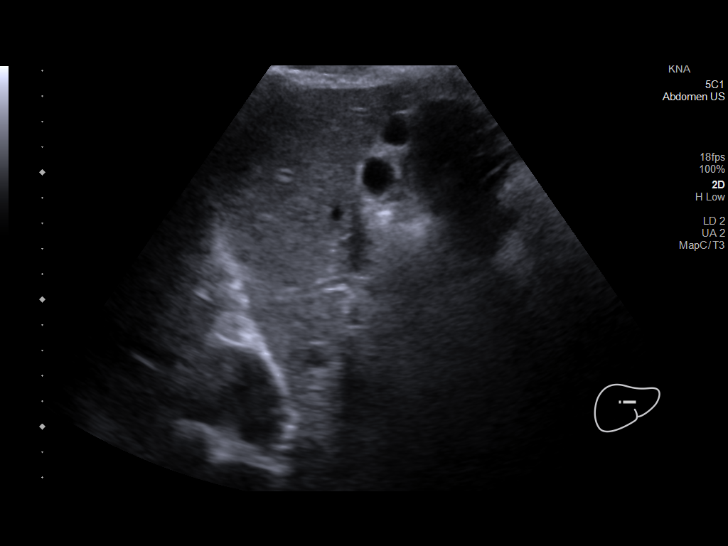
[im 43/47]
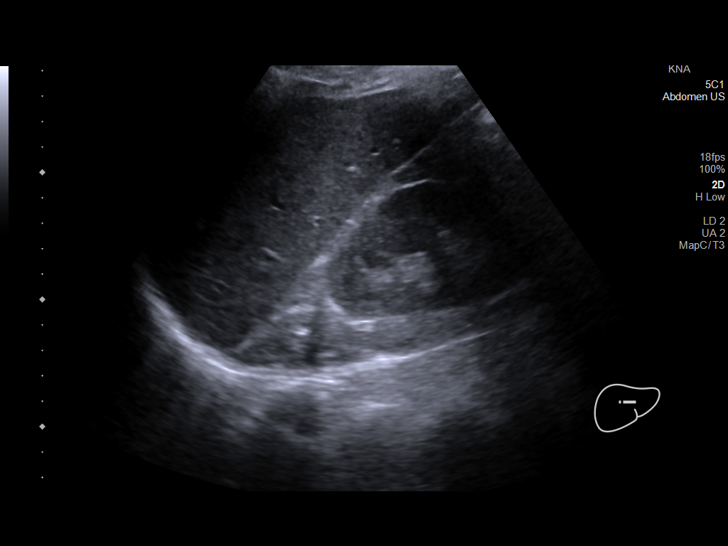
[im 47/47]
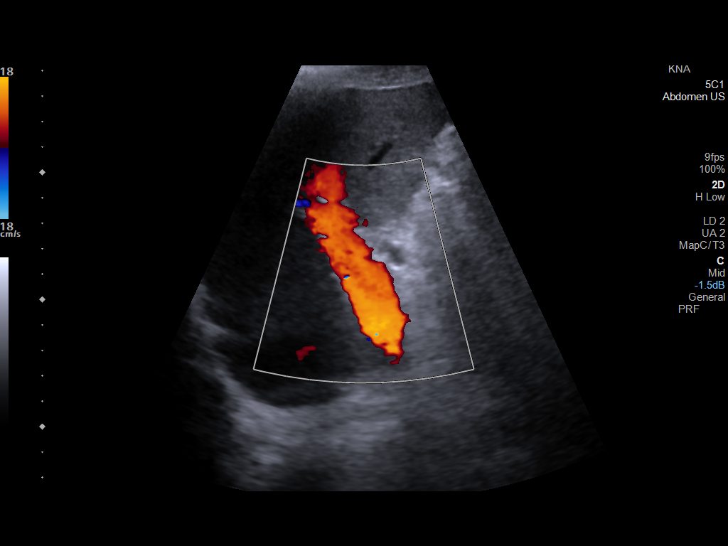

[14 of 25 positions shown; findings below may reference images not displayed]

FINDINGS: Gallbladder:

No gallstones or wall thickening visualized. No sonographic Murphy
sign noted by sonographer.

Common bile duct:

Diameter: 4 mm

Liver:

No focal lesion identified. Within normal limits in parenchymal
echogenicity. Portal vein is patent on color Doppler imaging with
normal direction of blood flow towards the liver.

Other: None.
IMPRESSION: Unremarkable right upper quadrant ultrasound.

## 2022-08-10 ENCOUNTER — Other Ambulatory Visit: Payer: Self-pay | Admitting: Gastroenterology

## 2022-09-13 ENCOUNTER — Other Ambulatory Visit: Payer: Self-pay | Admitting: Internal Medicine

## 2022-11-18 ENCOUNTER — Telehealth: Payer: Self-pay

## 2022-11-18 NOTE — Telephone Encounter (Signed)
Returned the pt's call from a vm. LMOVM for the pt to return call because we do close early today.

## 2022-11-23 NOTE — Telephone Encounter (Signed)
Phoned and LMOVM for the pt to return call and why

## 2023-01-03 DIAGNOSIS — L570 Actinic keratosis: Secondary | ICD-10-CM | POA: Diagnosis not present

## 2023-01-03 DIAGNOSIS — D0439 Carcinoma in situ of skin of other parts of face: Secondary | ICD-10-CM | POA: Diagnosis not present

## 2023-01-03 DIAGNOSIS — Z8589 Personal history of malignant neoplasm of other organs and systems: Secondary | ICD-10-CM | POA: Diagnosis not present

## 2023-01-03 DIAGNOSIS — L814 Other melanin hyperpigmentation: Secondary | ICD-10-CM | POA: Diagnosis not present

## 2023-01-03 DIAGNOSIS — L578 Other skin changes due to chronic exposure to nonionizing radiation: Secondary | ICD-10-CM | POA: Diagnosis not present

## 2023-01-03 DIAGNOSIS — Z86018 Personal history of other benign neoplasm: Secondary | ICD-10-CM | POA: Diagnosis not present

## 2023-01-03 DIAGNOSIS — D485 Neoplasm of uncertain behavior of skin: Secondary | ICD-10-CM | POA: Diagnosis not present

## 2023-03-23 ENCOUNTER — Other Ambulatory Visit: Payer: Self-pay | Admitting: Gastroenterology

## 2023-03-28 ENCOUNTER — Other Ambulatory Visit: Payer: Self-pay | Admitting: Gastroenterology

## 2023-04-12 ENCOUNTER — Telehealth: Payer: Self-pay | Admitting: Gastroenterology

## 2023-04-12 NOTE — Telephone Encounter (Signed)
Patient came to [facility]. And is asking for a refill on Omeprazole.

## 2023-04-12 NOTE — Telephone Encounter (Signed)
FYI:  Dr Marletta Lor,  I phoned the pt back and he wanted a refill on Omeprazole. I advised the pt that he needed an appt to be seen since it has been since 07-21-2021 since we last seen him and he was on recall 3 months after that for an EGD. Pt has had several refills but will not schedule a appt. I began to tell him why and he hung up on me. This visit is needed because if I have to do a PA on him insurance requires updated notes from the treating Physician. This is not the first time he has hung up on me due to not refilling his medication

## 2023-04-14 ENCOUNTER — Other Ambulatory Visit: Payer: Self-pay | Admitting: Internal Medicine

## 2023-04-14 MED ORDER — OMEPRAZOLE 40 MG PO CPDR: 40.0000 mg | DELAYED_RELEASE_CAPSULE | Freq: Every day | ORAL | 0 refills | Status: DC

## 2023-04-14 NOTE — Telephone Encounter (Signed)
90 day prescription sent to pharmacy. This will be the last script we send until OV. Otherwise he will need to get refilled through PCP. Thank you

## 2023-04-19 DIAGNOSIS — L570 Actinic keratosis: Secondary | ICD-10-CM | POA: Diagnosis not present

## 2023-04-19 DIAGNOSIS — D099 Carcinoma in situ, unspecified: Secondary | ICD-10-CM | POA: Diagnosis not present

## 2023-05-17 ENCOUNTER — Encounter: Payer: Self-pay | Admitting: Internal Medicine

## 2023-05-17 ENCOUNTER — Ambulatory Visit (INDEPENDENT_AMBULATORY_CARE_PROVIDER_SITE_OTHER): Payer: BC Managed Care – PPO | Admitting: Internal Medicine

## 2023-05-17 VITALS — BP 123/76 | HR 68 | Temp 97.3°F | Ht 70.0 in | Wt 187.8 lb

## 2023-05-17 DIAGNOSIS — D123 Benign neoplasm of transverse colon: Secondary | ICD-10-CM

## 2023-05-17 DIAGNOSIS — K2100 Gastro-esophageal reflux disease with esophagitis, without bleeding: Secondary | ICD-10-CM

## 2023-05-17 DIAGNOSIS — Z860101 Personal history of adenomatous and serrated colon polyps: Secondary | ICD-10-CM | POA: Diagnosis not present

## 2023-05-17 DIAGNOSIS — K2270 Barrett's esophagus without dysplasia: Secondary | ICD-10-CM

## 2023-05-17 MED ORDER — OMEPRAZOLE 40 MG PO CPDR: 40.0000 mg | DELAYED_RELEASE_CAPSULE | Freq: Two times a day (BID) | ORAL | 3 refills | Status: DC

## 2023-05-17 NOTE — Patient Instructions (Signed)
We will schedule you for upper endoscopy to reevaluate your significant reflux esophagitis.  Continue on omeprazole twice daily.  I have refilled this today.  Okay to stop your Carafate or take just as needed.  It was very nice seeing you again today.  Dr. Marletta Lor

## 2023-05-17 NOTE — Progress Notes (Signed)
Referring Provider: Gabriel Earing, FNP Primary Care Physician:  Gabriel Earing, FNP Primary GI:  Dr. Marletta Lor  Chief Complaint  Patient presents with   Follow-up    Patient here today for a follow up on his Benjamin Gill. Patient is taking pantoprazole 40 mg bid,though the script says once per day. He was also told he can take Carafate 1 gm tid - Qid prn.Benjamin Gill much better now that he is back on the ppi , as he had ran out of it and was given enough to last until todays visit.     HPI:   Benjamin Gill is a 62 y.o. male who presents to the clinic today for follow-up visit.  Has not been seen in our office since 07/21/2021.  Initially seen for right upper quadrant pain, GERD, dysphagia.  Underwent EGD 08/03/2021 which showed hiatal hernia, LA grade D esophagitis 5 cm in length, friable mucosa, spontaneous oozing after advancing endoscope.  Nodular appearance at the GE junction.  Distal esophageal biopsies showed reflux esophagitis, intestinal metaplasia consistent with Barrett's, cytology negative for yeast.  Gastric biopsies showed chronic inactive gastritis, negative for H. pylori.  Was placed on Omeprazole 40 mg twice daily with recommended repeat EGD in 10 to 12 weeks.  Unfortunately, patient did not follow-up.  Colonoscopy April 2022 due to positive Cologuard with numerous tubular adenomas removed recommended 3-year recall  Today, states he is doing better on omeprazole twice daily.  No longer having epigastric pain or reflux symptoms.  Dysphagia has resolved.  Past Medical History:  Diagnosis Date   Macular degeneration    left eye    Past Surgical History:  Procedure Laterality Date   BIOPSY  08/03/2021   Procedure: BIOPSY;  Surgeon: Lanelle Bal, DO;  [facility];  Service: Endoscopy;;   COLONOSCOPY WITH PROPOFOL N/A 10/05/2020   Procedure: COLONOSCOPY WITH PROPOFOL;  Surgeon: Lanelle Bal, DO;  [facility];  Service: Endoscopy;  Laterality: N/A;  AM    ESOPHAGEAL BRUSHING  08/03/2021   Procedure: ESOPHAGEAL BRUSHING;  Surgeon: Lanelle Bal, DO;  [facility];  Service: Endoscopy;;   ESOPHAGOGASTRODUODENOSCOPY (EGD) WITH PROPOFOL N/A 08/03/2021   Procedure: ESOPHAGOGASTRODUODENOSCOPY (EGD) WITH PROPOFOL;  Surgeon: Lanelle Bal, DO;  [facility];  Service: Endoscopy;  Laterality: N/A;  3:00pm   POLYPECTOMY  10/05/2020   Procedure: POLYPECTOMY;  Surgeon: Lanelle Bal, DO;  [facility];  Service: Endoscopy;;   WRIST FRACTURE SURGERY Bilateral 2011    Current Outpatient Medications  Medication Sig Dispense Refill   Glycerin-Hypromellose-PEG 400 (DRY EYE RELIEF DROPS) 0.2-0.2-1 % SOLN Place 1 drop into both eyes daily as needed. Thera tears     Misc Natural Products (NEURIVA) CAPS Take 1 tablet by mouth daily.     Multiple Vitamins-Minerals (MULTIVITAMIN WITH MINERALS) tablet Take 1 tablet by mouth daily.     Multiple Vitamins-Minerals (PRESERVISION AREDS 2) CAPS Take 1 tablet by mouth daily.     omeprazole (PRILOSEC) 40 MG capsule Take 1 capsule (40 mg total) by mouth daily. 90 capsule 0   sucralfate (CARAFATE) 1 g tablet TAKE 1 TABLET(1 GRAM) BY MOUTH FOUR TIMES DAILY 360 tablet 3   No current facility-administered medications for this visit.    Allergies as of 05/17/2023   (No Known Allergies)    Family History  Problem Relation Age of Onset   Celiac disease Father    Colon cancer Neg Hx  Social History   Socioeconomic History   Marital status: Married    Spouse name: Not on file   Number of children: 2   Years of education: 12   Highest education level: High school graduate  Occupational History    Employer: [employer]  Tobacco Use   Smoking status: Former    Current packs/day: 0.00    Average packs/day: 1 pack/day for 40.0 years (40.0 ttl pk-yrs)    Types: Cigarettes    Start date: 06/29/1978    Quit date: 06/29/2018    Years since quitting: 4.8   Smokeless  tobacco: Never  Vaping Use   Vaping status: Never Used  Substance and Sexual Activity   Alcohol use: Not Currently   Drug use: Not Currently   Sexual activity: Yes  Other Topics Concern   Not on file  Social History Narrative   Not on file   Social Determinants of Health   Financial Resource Strain: Not on file  Food Insecurity: Not on file  Transportation Needs: Not on file  Physical Activity: Not on file  Stress: Not on file  Social Connections: Not on file    Subjective: Review of Systems  Constitutional:  Negative for chills and fever.  HENT:  Negative for congestion and hearing loss.   Eyes:  Negative for blurred vision and double vision.  Respiratory:  Negative for cough and shortness of breath.   Cardiovascular:  Negative for chest pain and palpitations.  Gastrointestinal:  Negative for abdominal pain, blood in stool, constipation, diarrhea, heartburn, melena and vomiting.  Genitourinary:  Negative for dysuria and urgency.  Musculoskeletal:  Negative for joint pain and myalgias.  Skin:  Negative for itching and rash.  Neurological:  Negative for dizziness and headaches.  Psychiatric/Behavioral:  Negative for depression. The patient is not nervous/anxious.      Objective: BP 123/76 (BP Location: Left Arm, Patient Position: Sitting, Cuff Size: Normal)   Pulse 68   Temp (!) 97.3 F (36.3 C) (Temporal)   Ht 5\' 10"  (1.778 m)   Wt 187 lb 12.8 oz (85.2 kg)   BMI 26.95 kg/m  Physical Exam Constitutional:      Appearance: Normal appearance.  HENT:     Head: Normocephalic and atraumatic.  Eyes:     Extraocular Movements: Extraocular movements intact.     Conjunctiva/sclera: Conjunctivae normal.  Cardiovascular:     Rate and Rhythm: Normal rate and regular rhythm.  Pulmonary:     Effort: Pulmonary effort is normal.     Breath sounds: Normal breath sounds.  Abdominal:     General: Bowel sounds are normal.     Palpations: Abdomen is soft.  Musculoskeletal:         General: Normal range of motion.     Cervical back: Normal range of motion and neck supple.  Skin:    General: Skin is warm.  Neurological:     General: No focal deficit present.     Mental Status: He is alert and oriented to person, place, and time.  Psychiatric:        Mood and Affect: Mood normal.        Behavior: Behavior normal.      Assessment: *Chronic GERD-improved on omeprazole twice daily *Esophageal dysphagia-resolved *?Barrett's esophagus *Adenomatous colon polyps  Plan: Discussed patient's EGD findings in depth with him today.  Unfortunately did not follow-up after procedure in January 2023.  Will schedule for upper endoscopy today to evaluate healing of his esophagitis as well  as to rebiopsy for Barrett's esophagus.  Continue on omeprazole twice daily.  I refilled this today.  Okay to stop Carafate.  Colonoscopy recall 2025.  05/17/2023 2:53 PM   Disclaimer: This note was dictated with voice recognition software. Similar sounding words can inadvertently be transcribed and may not be corrected upon review.

## 2023-05-18 ENCOUNTER — Encounter: Payer: Self-pay | Admitting: *Deleted

## 2023-05-18 ENCOUNTER — Telehealth: Payer: Self-pay | Admitting: *Deleted

## 2023-05-18 NOTE — Telephone Encounter (Signed)
Called pt LMOVM to call back to schedule EGD with Dr. Marletta Lor, ASA 2

## 2023-05-18 NOTE — Telephone Encounter (Signed)
Benjamin Gill this patient returned your call at front desk and left a message that he was calling back to schedule.

## 2023-05-19 NOTE — Telephone Encounter (Signed)
Pt called back and scheduled for 12/23. Aware will send instructions via mychart.  Checked carelon and no PA required

## 2023-05-19 NOTE — Telephone Encounter (Signed)
LMTCB and also sent mychart message 

## 2023-06-22 ENCOUNTER — Encounter (HOSPITAL_COMMUNITY): Payer: Self-pay

## 2023-06-22 ENCOUNTER — Encounter (HOSPITAL_COMMUNITY)
Admission: RE | Admit: 2023-06-22 | Discharge: 2023-06-22 | Disposition: A | Discharge status: Home / Self Care | Payer: BC Managed Care – PPO | Source: Ambulatory Visit | Attending: Internal Medicine | Admitting: Internal Medicine

## 2023-06-26 ENCOUNTER — Encounter (HOSPITAL_COMMUNITY): Payer: Self-pay

## 2023-06-26 ENCOUNTER — Ambulatory Visit (HOSPITAL_COMMUNITY)
Admission: RE | Admit: 2023-06-26 | Discharge: 2023-06-26 | Disposition: A | Discharge status: Home / Self Care | Payer: BC Managed Care – PPO | Attending: Internal Medicine | Admitting: Internal Medicine

## 2023-06-26 ENCOUNTER — Ambulatory Visit (HOSPITAL_COMMUNITY): Payer: BC Managed Care – PPO | Admitting: Anesthesiology

## 2023-06-26 ENCOUNTER — Encounter (HOSPITAL_COMMUNITY)
Admission: RE | Disposition: A | Discharge status: Home / Self Care | Payer: Self-pay | Source: Home / Self Care | Attending: Internal Medicine

## 2023-06-26 DIAGNOSIS — I10 Essential (primary) hypertension: Secondary | ICD-10-CM | POA: Insufficient documentation

## 2023-06-26 DIAGNOSIS — Z87891 Personal history of nicotine dependence: Secondary | ICD-10-CM | POA: Insufficient documentation

## 2023-06-26 DIAGNOSIS — K222 Esophageal obstruction: Secondary | ICD-10-CM | POA: Diagnosis not present

## 2023-06-26 DIAGNOSIS — Z8379 Family history of other diseases of the digestive system: Secondary | ICD-10-CM | POA: Insufficient documentation

## 2023-06-26 DIAGNOSIS — K219 Gastro-esophageal reflux disease without esophagitis: Secondary | ICD-10-CM | POA: Insufficient documentation

## 2023-06-26 DIAGNOSIS — K2270 Barrett's esophagus without dysplasia: Secondary | ICD-10-CM | POA: Diagnosis not present

## 2023-06-26 DIAGNOSIS — K449 Diaphragmatic hernia without obstruction or gangrene: Secondary | ICD-10-CM

## 2023-06-26 DIAGNOSIS — Z09 Encounter for follow-up examination after completed treatment for conditions other than malignant neoplasm: Secondary | ICD-10-CM | POA: Diagnosis not present

## 2023-06-26 DIAGNOSIS — K209 Esophagitis, unspecified without bleeding: Secondary | ICD-10-CM | POA: Diagnosis not present

## 2023-06-26 DIAGNOSIS — K2289 Other specified disease of esophagus: Secondary | ICD-10-CM | POA: Diagnosis not present

## 2023-06-26 SURGERY — ESOPHAGOGASTRODUODENOSCOPY (EGD) WITH PROPOFOL
Anesthesia: General

## 2023-06-26 MED ORDER — PHENYLEPHRINE 80 MCG/ML (10ML) SYRINGE FOR IV PUSH (FOR BLOOD PRESSURE SUPPORT)
PREFILLED_SYRINGE | INTRAVENOUS | Status: DC | PRN
  Administered 2023-06-26: 160 ug via INTRAVENOUS

## 2023-06-26 MED ORDER — LIDOCAINE HCL (CARDIAC) PF 100 MG/5ML IV SOSY
PREFILLED_SYRINGE | INTRAVENOUS | Status: DC | PRN
  Administered 2023-06-26: 100 mg via INTRATRACHEAL

## 2023-06-26 MED ORDER — PROPOFOL 10 MG/ML IV BOLUS
INTRAVENOUS | Status: DC | PRN
  Administered 2023-06-26 (×2): 20 mg via INTRAVENOUS
  Administered 2023-06-26: 80 mg via INTRAVENOUS

## 2023-06-26 MED ORDER — LACTATED RINGERS IV SOLN: INTRAVENOUS | Status: DC

## 2023-06-26 NOTE — Discharge Instructions (Addendum)
EGD Discharge instructions Please read the instructions outlined below and refer to this sheet in the next few weeks. These discharge instructions provide you with general information on caring for yourself after you leave the hospital. Your doctor may also give you specific instructions. While your treatment has been planned according to the most current medical practices available, unavoidable complications occasionally occur. If you have any problems or questions after discharge, please call your doctor. ACTIVITY You may resume your regular activity but move at a slower pace for the next 24 hours.  Take frequent rest periods for the next 24 hours.  Walking will help expel (get rid of) the air and reduce the bloated feeling in your abdomen.  No driving for 24 hours (because of the anesthesia (medicine) used during the test).  You may shower.  Do not sign any important legal documents or operate any machinery for 24 hours (because of the anesthesia used during the test).  NUTRITION Drink plenty of fluids.  You may resume your normal diet.  Begin with a light meal and progress to your normal diet.  Avoid alcoholic beverages for 24 hours or as instructed by your caregiver.  MEDICATIONS You may resume your normal medications unless your caregiver tells you otherwise.  WHAT YOU CAN EXPECT TODAY You may experience abdominal discomfort such as a feeling of fullness or "gas" pains.  FOLLOW-UP Your doctor will discuss the results of your test with you.  SEEK IMMEDIATE MEDICAL ATTENTION IF ANY OF THE FOLLOWING OCCUR: Excessive nausea (feeling sick to your stomach) and/or vomiting.  Severe abdominal pain and distention (swelling).  Trouble swallowing.  Temperature over 101 F (37.8 C).  Rectal bleeding or vomiting of blood.   Your upper endoscopy revealed medium size hiatal hernia, mild nonobstructing Schatzki's ring.  Evidence of short segment Barrett's esophagus, took samples today.  Previously  noted significant esophagitis has completely healed.  Stomach and small bowel appeared normal. Await pathology results, my office will contact you.  Continue current medications.  Repeat upper endoscopy in 5 years.  Follow-up in GI office in 3 to 4 months.   I hope you have a great rest of your week!  Hennie Duos. Marletta Lor, D.O. Gastroenterology and Hepatology [facility]

## 2023-06-26 NOTE — Op Note (Signed)
[facility] Patient Name: Benjamin Gill Procedure Date: 06/26/2023 10:40 AM MRN: [id number] Date of Birth: [birth year] Attending MD: Hennie Duos. Marletta Lor , [place], [id number] CSN: [id number] Age: 62 Admit Type: Outpatient Procedure:                Upper GI endoscopy Indications:              Surveillance procedure, Follow-up of Barrett's                            esophagus, Follow-up of gastro-esophageal reflux                            disease Providers:                Hennie Duos. Marletta Lor, DO, Francoise Ceo RN, RN, Lennice Sites Technician, Technician Referring MD:              Medicines:                See the Anesthesia note for documentation of the                            administered medications Complications:            No immediate complications. Estimated Blood Loss:     Estimated blood loss was minimal. Procedure:                Pre-Anesthesia Assessment:                           - The anesthesia plan was to use monitored                            anesthesia care (MAC).                           After obtaining informed consent, the endoscope was                            passed under direct vision. Throughout the                            procedure, the patient's blood pressure, pulse, and                            oxygen saturations were monitored continuously. The                            GIF-H190 ([id number]) scope was introduced through the                            mouth, and advanced to the second part of duodenum.                            The upper GI endoscopy was accomplished without  difficulty. The patient tolerated the procedure                            well. Scope In: 10:51:58 AM Scope Out: 10:55:16 AM Total Procedure Duration: 0 hours 3 minutes 18 seconds  Findings:      A non-obstructing and mild Schatzki ring was found in the distal       esophagus.      A 4 cm hiatal hernia was present.      There  were esophageal mucosal changes secondary to established       short-segment Barrett's disease present at the gastroesophageal       junction. The maximum longitudinal extent of these mucosal changes was 1       cm in length. Mucosa was biopsied with a cold forceps for histology. One       specimen bottle was sent to pathology. Previous esophagitis has healed.      The entire examined stomach was normal.      The duodenal bulb, first portion of the duodenum and second portion of       the duodenum were normal. Impression:               - Non-obstructing and mild Schatzki ring.                           - 4 cm hiatal hernia.                           - Esophageal mucosal changes secondary to                            established short-segment Barrett's disease.                            Biopsied.                           - Normal stomach.                           - Normal duodenal bulb, first portion of the                            duodenum and second portion of the duodenum. Moderate Sedation:      Per Anesthesia Care Recommendation:           - Patient has a contact number available for                            emergencies. The signs and symptoms of potential                            delayed complications were discussed with the                            patient. Return to normal activities tomorrow.  Written discharge instructions were provided to the                            patient.                           - Resume previous diet.                           - Continue present medications.                           - Await pathology results.                           - Repeat upper endoscopy in 5 years for                            surveillance.                           - Return to GI office in 3 months. Procedure Code(s):        --- Professional ---                           [phone], Esophagogastroduodenoscopy, flexible,                             transoral; with biopsy, single or multiple Diagnosis Code(s):        --- Professional ---                           K22.2, Esophageal obstruction                           K44.9, Diaphragmatic hernia without obstruction or                            gangrene                           K22.70, Barrett's esophagus without dysplasia                           K21.9, Gastro-esophageal reflux disease without                            esophagitis CPT copyright 2022 American Medical Association. All rights reserved. The codes documented in this report are preliminary and upon coder review may  be revised to meet current compliance requirements. Hennie Duos. Marletta Lor, DO Hennie Duos. Marletta Lor, DO 06/26/2023 10:58:25 AM This report has been signed electronically. Number of Addenda: 0

## 2023-06-26 NOTE — Anesthesia Preprocedure Evaluation (Signed)

## 2023-06-26 NOTE — H&P (Signed)
Primary Care Physician:  Gabriel Earing, FNP Primary Gastroenterologist:  Dr. Marletta Lor  Pre-Procedure History & Physical: HPI:  Benjamin Gill is a 62 y.o. male is here for an EGD to be performed for GERD, Barrett's esophagus  Past Medical History:  Diagnosis Date   Macular degeneration    left eye    Past Surgical History:  Procedure Laterality Date   BIOPSY  08/03/2021   Procedure: BIOPSY;  Surgeon: Lanelle Bal, DO;  [facility];  Service: Endoscopy;;   COLONOSCOPY WITH PROPOFOL N/A 10/05/2020   Procedure: COLONOSCOPY WITH PROPOFOL;  Surgeon: Lanelle Bal, DO;  [facility];  Service: Endoscopy;  Laterality: N/A;  AM   ESOPHAGEAL BRUSHING  08/03/2021   Procedure: ESOPHAGEAL BRUSHING;  Surgeon: Lanelle Bal, DO;  [facility];  Service: Endoscopy;;   ESOPHAGOGASTRODUODENOSCOPY (EGD) WITH PROPOFOL N/A 08/03/2021   Procedure: ESOPHAGOGASTRODUODENOSCOPY (EGD) WITH PROPOFOL;  Surgeon: Lanelle Bal, DO;  [facility];  Service: Endoscopy;  Laterality: N/A;  3:00pm   POLYPECTOMY  10/05/2020   Procedure: POLYPECTOMY;  Surgeon: Lanelle Bal, DO;  [facility];  Service: Endoscopy;;   WRIST FRACTURE SURGERY Bilateral 2011    Prior to Admission medications   Medication Sig Start Date End Date Taking? Authorizing Provider  Glycerin-Hypromellose-PEG 400 (DRY EYE RELIEF DROPS) 0.2-0.2-1 % SOLN Place 1 drop into both eyes daily as needed. Thera tears   Yes [provider]  Misc Natural Products (NEURIVA) CAPS Take 1 tablet by mouth daily.   Yes [provider]  Multiple Vitamins-Minerals (MULTIVITAMIN WITH MINERALS) tablet Take 1 tablet by mouth daily.   Yes [provider]  Multiple Vitamins-Minerals (PRESERVISION AREDS 2) CAPS Take 1 tablet by mouth daily.   Yes [provider]  omeprazole (PRILOSEC) 40 MG capsule Take 1 capsule (40 mg total) by mouth in the morning and at bedtime.  05/17/23 05/16/24 Yes Slater Mcmanaman K, DO  sucralfate (CARAFATE) 1 g tablet TAKE 1 TABLET(1 GRAM) BY MOUTH FOUR TIMES DAILY 08/11/22  Yes Lanelle Bal, DO    Allergies as of 05/19/2023   (No Known Allergies)    Family History  Problem Relation Age of Onset   Celiac disease Father    Colon cancer Neg Hx     Social History   Socioeconomic History   Marital status: Married    Spouse name: Not on file   Number of children: 2   Years of education: 12   Highest education level: High school graduate  Occupational History    Employer: [employer]  Tobacco Use   Smoking status: Former    Current packs/day: 0.00    Average packs/day: 1 pack/day for 40.0 years (40.0 ttl pk-yrs)    Types: Cigarettes    Start date: 06/29/1978    Quit date: 06/29/2018    Years since quitting: 4.9   Smokeless tobacco: Never  Vaping Use   Vaping status: Never Used  Substance and Sexual Activity   Alcohol use: Not Currently   Drug use: Not Currently   Sexual activity: Yes  Other Topics Concern   Not on file  Social History Narrative   Not on file   Social Drivers of Health   Financial Resource Strain: Not on file  Food Insecurity: Not on file  Transportation Needs: Not on file  Physical Activity: Not on file  Stress: Not on file  Social Connections: Not on file  Intimate Partner Violence:  Not on file    Review of Systems: General: Negative for fever, chills, fatigue, weakness. Eyes: Negative for vision changes.  ENT: Negative for hoarseness, difficulty swallowing , nasal congestion. CV: Negative for chest pain, angina, palpitations, dyspnea on exertion, peripheral edema.  Respiratory: Negative for dyspnea at rest, dyspnea on exertion, cough, sputum, wheezing.  GI: See history of present illness. GU:  Negative for dysuria, hematuria, urinary incontinence, urinary frequency, nocturnal urination.  MS: Negative for joint pain, low back pain.  Derm: Negative for rash or itching.   Neuro: Negative for weakness, abnormal sensation, seizure, frequent headaches, memory loss, confusion.  Psych: Negative for anxiety, depression Endo: Negative for unusual weight change.  Heme: Negative for bruising or bleeding. Allergy: Negative for rash or hives.  Physical Exam: Vital signs in last 24 hours: Temp:  [97.7 F (36.5 C)] 97.7 F (36.5 C) (12/23 1012) Pulse Rate:  [59] 59 (12/23 1012) Resp:  [14] 14 (12/23 1012) BP: (104)/(76) 104/76 (12/23 1012) SpO2:  [99 %] 99 % (12/23 1012) Weight:  [81.6 kg] 81.6 kg (12/23 1012)   General:   Alert,  Well-developed, well-nourished, pleasant and cooperative in NAD Head:  Normocephalic and atraumatic. Eyes:  Sclera clear, no icterus.   Conjunctiva pink. Ears:  Normal auditory acuity. Nose:  No deformity, discharge,  or lesions. Msk:  Symmetrical without gross deformities. Normal posture. Extremities:  Without clubbing or edema. Neurologic:  Alert and  oriented x4;  grossly normal neurologically. Skin:  Intact without significant lesions or rashes. Psych:  Alert and cooperative. Normal mood and affect.   Impression/Plan: Benjamin Gill is here for an EGD to be performed for GERD, Barrett's esophagus  Risks, benefits, limitations, imponderables and alternatives regarding procedure have been reviewed with the patient. Questions have been answered. All parties agreeable.

## 2023-06-26 NOTE — Transfer of Care (Signed)
Immediate Anesthesia Transfer of Care Note  Patient: Benjamin Gill  Procedure(s) Performed: ESOPHAGOGASTRODUODENOSCOPY (EGD) WITH PROPOFOL BIOPSY  Patient Location: PACU  Anesthesia Type:General  Level of Consciousness: awake, alert , and oriented  Airway & Oxygen Therapy: Patient Spontanous Breathing  Post-op Assessment: Report given to RN and Post -op Vital signs reviewed and stable  Post vital signs: Reviewed and stable  Last Vitals:  Vitals Value Taken Time  BP 85/56 06/26/23 1100  Temp 36.4 C 06/26/23 1100  Pulse 67 06/26/23 1100  Resp 97 06/26/23 1100  SpO2 97 % 06/26/23 1100    Last Pain:  Vitals:   06/26/23 1100  TempSrc: Axillary  PainSc: 0-No pain         Complications: No notable events documented.

## 2023-06-27 LAB — SURGICAL PATHOLOGY

## 2023-06-27 NOTE — Anesthesia Postprocedure Evaluation (Signed)
Anesthesia Post Note  Patient: Benjamin Gill  Procedure(s) Performed: ESOPHAGOGASTRODUODENOSCOPY (EGD) WITH PROPOFOL BIOPSY  Patient location during evaluation: Phase II Anesthesia Type: General Level of consciousness: awake Pain management: pain level controlled Vital Signs Assessment: post-procedure vital signs reviewed and stable Respiratory status: spontaneous breathing and respiratory function stable Cardiovascular status: blood pressure returned to baseline and stable Postop Assessment: no headache and no apparent nausea or vomiting Anesthetic complications: no Comments: Late entry   No notable events documented.   Last Vitals:  Vitals:   06/26/23 1100 06/26/23 1104  BP: (!) 85/56 101/74  Pulse: 67 65  Resp: 16 15  Temp: 36.4 C   SpO2: 97% 97%    Last Pain:  Vitals:   06/26/23 1100  TempSrc: Axillary  PainSc: 0-No pain                 Windell Norfolk

## 2023-08-17 DIAGNOSIS — L570 Actinic keratosis: Secondary | ICD-10-CM | POA: Diagnosis not present

## 2023-08-17 DIAGNOSIS — D485 Neoplasm of uncertain behavior of skin: Secondary | ICD-10-CM | POA: Diagnosis not present

## 2023-08-28 ENCOUNTER — Encounter: Payer: Self-pay | Admitting: Internal Medicine

## 2023-09-08 ENCOUNTER — Encounter: Payer: Self-pay | Admitting: *Deleted

## 2023-10-04 ENCOUNTER — Telehealth: Payer: Self-pay | Admitting: *Deleted

## 2023-10-04 NOTE — Telephone Encounter (Signed)
  Procedure: colonoscopy  Height: 5'10" Weight: 180 lb       Have you had a colonoscopy before?  10/05/20, Dr.Carver  Do you have family history of colon cancer?  no  Do you have a family history of polyps? no  Previous colonoscopy with polyps removed? yes  Do you have a history colorectal cancer?   no  Are you diabetic?  no  Do you have a prosthetic or mechanical heart valve? no  Do you have a pacemaker/defibrillator?   no  Have you had endocarditis/atrial fibrillation?  no  Do you use supplemental oxygen/CPAP?  no  Have you had joint replacement within the last 12 months?  no  Do you tend to be constipated or have to use laxatives?  no   Do you have history of alcohol use? If yes, how much and how often.  no  Do you have history or are you using drugs? If yes, what do are you  using?  no  Have you ever had a stroke/heart attack?  no  Have you ever had a heart or other vascular stent placed,?no  Do you take weight loss medication? no  Do you take any blood-thinning medications such as: (Plavix, aspirin, Coumadin, Aggrenox, Brilinta, Xarelto, Eliquis, Pradaxa, Savaysa or Effient)? no  If yes we need the name, milligram, dosage and who is prescribing doctor:  n/a             Current Outpatient Medications  Medication Sig Dispense Refill   Glycerin-Hypromellose-PEG 400 (DRY EYE RELIEF DROPS) 0.2-0.2-1 % SOLN Place 1 drop into both eyes daily as needed. Thera tears (Patient not taking: Reported on 10/04/2023)     Misc Natural Products (NEURIVA) CAPS Take 1 tablet by mouth daily. (Patient not taking: Reported on 10/04/2023)     Multiple Vitamins-Minerals (MULTIVITAMIN WITH MINERALS) tablet Take 1 tablet by mouth daily. (Patient not taking: Reported on 10/04/2023)     Multiple Vitamins-Minerals (PRESERVISION AREDS 2) CAPS Take 1 tablet by mouth daily. (Patient not taking: Reported on 10/04/2023)     omeprazole (PRILOSEC) 40 MG capsule Take 1 capsule (40 mg total) by mouth in the  morning and at bedtime. (Patient not taking: Reported on 10/04/2023) 180 capsule 3   sucralfate (CARAFATE) 1 g tablet TAKE 1 TABLET(1 GRAM) BY MOUTH FOUR TIMES DAILY (Patient not taking: Reported on 10/04/2023) 360 tablet 3   No current facility-administered medications for this visit.    No Known Allergies

## 2023-11-06 NOTE — Telephone Encounter (Signed)
 ASA 2 - ok to schedule.

## 2023-11-07 NOTE — Telephone Encounter (Signed)
 LMTRC

## 2023-11-08 ENCOUNTER — Encounter: Payer: Self-pay | Admitting: *Deleted

## 2023-11-15 ENCOUNTER — Encounter (INDEPENDENT_AMBULATORY_CARE_PROVIDER_SITE_OTHER): Admitting: Ophthalmology

## 2023-11-15 DIAGNOSIS — H353132 Nonexudative age-related macular degeneration, bilateral, intermediate dry stage: Secondary | ICD-10-CM

## 2023-11-15 DIAGNOSIS — H43813 Vitreous degeneration, bilateral: Secondary | ICD-10-CM

## 2023-12-12 ENCOUNTER — Encounter: Payer: Self-pay | Admitting: *Deleted

## 2023-12-12 NOTE — Telephone Encounter (Signed)
 LMTRC..mailed letter

## 2023-12-13 ENCOUNTER — Other Ambulatory Visit: Payer: Self-pay | Admitting: *Deleted

## 2023-12-13 ENCOUNTER — Encounter: Payer: Self-pay | Admitting: *Deleted

## 2023-12-13 MED ORDER — PEG 3350-KCL-NA BICARB-NACL 420 G PO SOLR: 4000.0000 mL | Freq: Once | ORAL | 0 refills | Status: AC

## 2023-12-13 NOTE — Telephone Encounter (Signed)
 Pt has been scheduled for 01/09/24 with Dr.Carver. instructions mailed and prep sent to pharmacy

## 2023-12-14 NOTE — Telephone Encounter (Signed)
 Questionnaire from recall, no referral needed

## 2024-01-09 ENCOUNTER — Other Ambulatory Visit: Payer: Self-pay

## 2024-01-09 ENCOUNTER — Ambulatory Visit (HOSPITAL_COMMUNITY)
Admission: RE | Admit: 2024-01-09 | Discharge: 2024-01-09 | Disposition: A | Discharge status: Home / Self Care | Attending: Internal Medicine | Admitting: Internal Medicine

## 2024-01-09 ENCOUNTER — Ambulatory Visit (HOSPITAL_COMMUNITY): Admitting: Certified Registered Nurse Anesthetist

## 2024-01-09 ENCOUNTER — Encounter (HOSPITAL_COMMUNITY)
Admission: RE | Disposition: A | Discharge status: Home / Self Care | Payer: Self-pay | Source: Home / Self Care | Attending: Internal Medicine

## 2024-01-09 ENCOUNTER — Encounter (HOSPITAL_COMMUNITY): Payer: Self-pay | Admitting: Internal Medicine

## 2024-01-09 DIAGNOSIS — D124 Benign neoplasm of descending colon: Secondary | ICD-10-CM | POA: Insufficient documentation

## 2024-01-09 DIAGNOSIS — Z87891 Personal history of nicotine dependence: Secondary | ICD-10-CM | POA: Insufficient documentation

## 2024-01-09 DIAGNOSIS — I10 Essential (primary) hypertension: Secondary | ICD-10-CM | POA: Diagnosis not present

## 2024-01-09 DIAGNOSIS — D120 Benign neoplasm of cecum: Secondary | ICD-10-CM | POA: Diagnosis not present

## 2024-01-09 DIAGNOSIS — D128 Benign neoplasm of rectum: Secondary | ICD-10-CM | POA: Diagnosis not present

## 2024-01-09 DIAGNOSIS — K5730 Diverticulosis of large intestine without perforation or abscess without bleeding: Secondary | ICD-10-CM | POA: Insufficient documentation

## 2024-01-09 DIAGNOSIS — D123 Benign neoplasm of transverse colon: Secondary | ICD-10-CM | POA: Diagnosis not present

## 2024-01-09 DIAGNOSIS — K635 Polyp of colon: Secondary | ICD-10-CM | POA: Diagnosis not present

## 2024-01-09 DIAGNOSIS — K648 Other hemorrhoids: Secondary | ICD-10-CM | POA: Diagnosis not present

## 2024-01-09 DIAGNOSIS — Z860101 Personal history of adenomatous and serrated colon polyps: Secondary | ICD-10-CM | POA: Diagnosis not present

## 2024-01-09 DIAGNOSIS — Z1211 Encounter for screening for malignant neoplasm of colon: Secondary | ICD-10-CM | POA: Insufficient documentation

## 2024-01-09 HISTORY — PX: COLONOSCOPY: SHX5424

## 2024-01-09 SURGERY — COLONOSCOPY
Anesthesia: General

## 2024-01-09 MED ORDER — OMEPRAZOLE 40 MG PO CPDR: 40.0000 mg | DELAYED_RELEASE_CAPSULE | Freq: Every day | ORAL | 3 refills | Status: DC

## 2024-01-09 MED ORDER — PROPOFOL 500 MG/50ML IV EMUL
INTRAVENOUS | Status: DC | PRN
  Administered 2024-01-09: 100 mg via INTRAVENOUS
  Administered 2024-01-09: 150 ug/kg/min via INTRAVENOUS

## 2024-01-09 MED ORDER — LACTATED RINGERS IV SOLN: INTRAVENOUS | Status: DC | PRN

## 2024-01-09 MED ORDER — PHENYLEPHRINE 80 MCG/ML (10ML) SYRINGE FOR IV PUSH (FOR BLOOD PRESSURE SUPPORT)
PREFILLED_SYRINGE | INTRAVENOUS | Status: DC | PRN
Start: 2024-01-09 — End: 2024-01-09
  Administered 2024-01-09: 160 ug via INTRAVENOUS
  Administered 2024-01-09 (×2): 80 ug via INTRAVENOUS
  Administered 2024-01-09: 160 ug via INTRAVENOUS

## 2024-01-09 MED ORDER — LACTATED RINGERS IV SOLN: INTRAVENOUS | Status: DC

## 2024-01-09 NOTE — Discharge Instructions (Addendum)
  Colonoscopy Discharge Instructions  Read the instructions outlined below and refer to this sheet in the next few weeks. These discharge instructions provide you with general information on caring for yourself after you leave the hospital. Your doctor may also give you specific instructions. While your treatment has been planned according to the most current medical practices available, unavoidable complications occasionally occur.   ACTIVITY You may resume your regular activity, but move at a slower pace for the next 24 hours.  Take frequent rest periods for the next 24 hours.  Walking will help get rid of the air and reduce the bloated feeling in your belly (abdomen).  No driving for 24 hours (because of the medicine (anesthesia) used during the test).   Do not sign any important legal documents or operate any machinery for 24 hours (because of the anesthesia used during the test).  NUTRITION Drink plenty of fluids.  You may resume your normal diet as instructed by your doctor.  Begin with a light meal and progress to your normal diet. Heavy or fried foods are harder to digest and may make you feel sick to your stomach (nauseated).  Avoid alcoholic beverages for 24 hours or as instructed.  MEDICATIONS You may resume your normal medications unless your doctor tells you otherwise.  WHAT YOU CAN EXPECT TODAY Some feelings of bloating in the abdomen.  Passage of more gas than usual.  Spotting of blood in your stool or on the toilet paper.  IF YOU HAD POLYPS REMOVED DURING THE COLONOSCOPY: No aspirin products for 7 days or as instructed.  No alcohol for 7 days or as instructed.  Eat a soft diet for the next 24 hours.  FINDING OUT THE RESULTS OF YOUR TEST Not all test results are available during your visit. If your test results are not back during the visit, make an appointment with your caregiver to find out the results. Do not assume everything is normal if you have not heard from your  caregiver or the medical facility. It is important for you to follow up on all of your test results.  SEEK IMMEDIATE MEDICAL ATTENTION IF: You have more than a spotting of blood in your stool.  Your belly is swollen (abdominal distention).  You are nauseated or vomiting.  You have a temperature over 101.  You have abdominal pain or discomfort that is severe or gets worse throughout the day.   Your colonoscopy revealed 5 polyp(s) which I removed successfully. Await pathology results, my office will contact you. I recommend repeating colonoscopy in 3-5 years for surveillance purposes depending on pathology results.    I hope you have a great rest of your week!  Carlin POUR. Cindie, D.O. Gastroenterology and Hepatology [facility]

## 2024-01-09 NOTE — H&P (Signed)
 Primary Care Physician:  Lavell Bari LABOR, FNP Primary Gastroenterologist:  Dr. Cindie  Pre-Procedure History & Physical: HPI:  Benjamin Gill is a 63 y.o. male is here for a colonoscopy to be performed for surveillance purposes, personal history of adenomatous colon polyps in 2022  Past Medical History:  Diagnosis Date   Macular degeneration    left eye    Past Surgical History:  Procedure Laterality Date   BIOPSY  08/03/2021   Procedure: BIOPSY;  Surgeon: Cindie Carlin POUR, DO;  Location: [facility];  Service: Endoscopy;;   COLONOSCOPY WITH PROPOFOL  N/A 10/05/2020   Procedure: COLONOSCOPY WITH PROPOFOL ;  Surgeon: Cindie Carlin POUR, DO;  Location: [facility];  Service: Endoscopy;  Laterality: N/A;  AM   ESOPHAGEAL BRUSHING  08/03/2021   Procedure: ESOPHAGEAL BRUSHING;  Surgeon: Cindie Carlin POUR, DO;  Location: [facility];  Service: Endoscopy;;   ESOPHAGOGASTRODUODENOSCOPY (EGD) WITH PROPOFOL  N/A 08/03/2021   Procedure: ESOPHAGOGASTRODUODENOSCOPY (EGD) WITH PROPOFOL ;  Surgeon: Cindie Carlin POUR, DO;  [facility];  Service: Endoscopy;  Laterality: N/A;  3:00pm   POLYPECTOMY  10/05/2020   Procedure: POLYPECTOMY;  Surgeon: Cindie Carlin POUR, DO;  Location: [facility];  Service: Endoscopy;;   WRIST FRACTURE SURGERY Bilateral 2011    Prior to Admission medications   Medication Sig Start Date End Date Taking? Authorizing Provider  Glycerin-Hypromellose-PEG 400 (DRY EYE RELIEF DROPS) 0.2-0.2-1 % SOLN Place 1 drop into both eyes daily as needed. Thera tears Patient not taking: Reported on 10/04/2023    [provider]  Misc Natural Products (NEURIVA) CAPS Take 1 tablet by mouth daily. Patient not taking: Reported on 10/04/2023    [provider]  Multiple Vitamins-Minerals (MULTIVITAMIN WITH MINERALS) tablet Take 1 tablet by mouth daily. Patient not taking: Reported on 10/04/2023    [provider]  Multiple Vitamins-Minerals (PRESERVISION AREDS 2) CAPS  Take 1 tablet by mouth daily. Patient not taking: Reported on 10/04/2023    [provider]  omeprazole  (PRILOSEC) 40 MG capsule Take 1 capsule (40 mg total) by mouth in the morning and at bedtime. Patient not taking: Reported on 10/04/2023 05/17/23 05/16/24  Cindie Carlin POUR, DO  sucralfate  (CARAFATE ) 1 g tablet TAKE 1 TABLET(1 GRAM) BY MOUTH FOUR TIMES DAILY Patient not taking: Reported on 10/04/2023 08/11/22   Cindie Carlin POUR, DO    Allergies as of 12/13/2023   (No Known Allergies)    Family History  Problem Relation Age of Onset   Celiac disease Father    Colon cancer Neg Hx     Social History   Socioeconomic History   Marital status: Married    Spouse name: Not on file   Number of children: 2   Years of education: 12   Highest education level: High school graduate  Occupational History    Employer: [employer]  Tobacco Use   Smoking status: Former    Current packs/day: 0.00    Average packs/day: 1 pack/day for 40.0 years (40.0 ttl pk-yrs)    Types: Cigarettes    Start date: 06/29/1978    Quit date: 06/29/2018    Years since quitting: 5.5   Smokeless tobacco: Never  Vaping Use   Vaping status: Never Used  Substance and Sexual Activity   Alcohol use: Not Currently   Drug use: Not Currently   Sexual activity: Yes  Other Topics Concern   Not on file  Social History Narrative   Not on file   Social Drivers  of Health   Financial Resource Strain: Not on file  Food Insecurity: Not on file  Transportation Needs: Not on file  Physical Activity: Not on file  Stress: Not on file  Social Connections: Not on file  Intimate Partner Violence: Not on file    Review of Systems: See HPI, otherwise negative ROS  Physical Exam: Vital signs in last 24 hours:     General:   Alert,  Well-developed, well-nourished, pleasant and cooperative in NAD Head:  Normocephalic and atraumatic. Eyes:  Sclera clear, no icterus.   Conjunctiva pink. Ears:  Normal auditory  acuity. Nose:  No deformity, discharge,  or lesions. Msk:  Symmetrical without gross deformities. Normal posture. Extremities:  Without clubbing or edema. Neurologic:  Alert and  oriented x4;  grossly normal neurologically. Skin:  Intact without significant lesions or rashes. Psych:  Alert and cooperative. Normal mood and affect.  Impression/Plan: Benjamin Gill is here for a colonoscopy to be performed for surveillance purposes, personal history of adenomatous colon polyps in 2022  The risks of the procedure including infection, bleed, or perforation as well as benefits, limitations, alternatives and imponderables have been reviewed with the patient. Questions have been answered. All parties agreeable.

## 2024-01-09 NOTE — Transfer of Care (Signed)
 Immediate Anesthesia Transfer of Care Note  Patient: KEYAAN LEDERMAN  Procedure(s) Performed: COLONOSCOPY  Patient Location: Endoscopy Unit  Anesthesia Type:General  Level of Consciousness: awake, alert , and oriented  Airway & Oxygen Therapy: Patient Spontanous Breathing  Post-op Assessment: Report given to RN and Post -op Vital signs reviewed and stable  Post vital signs: Reviewed and stable  Last Vitals:  Vitals Value Taken Time  BP 83/51 01/09/24 10:16  Temp 36.5 C 01/09/24 10:16  Pulse 78 01/09/24 10:16  Resp 17 01/09/24 10:16  SpO2 95 % 01/09/24 10:16    Last Pain:  Vitals:   01/09/24 1016  TempSrc: Oral  PainSc: 0-No pain      Patients Stated Pain Goal: 4 (01/09/24 0941)  Complications: No notable events documented.

## 2024-01-09 NOTE — Op Note (Signed)
 [facility] Patient Name: Benjamin Gill Procedure Date: 01/09/2024 9:26 AM MRN: [id number] Date of Birth: Feb 19, 1961 Attending MD: Carlin POUR. Cindie , [place], [id number] CSN: [id number] Age: 63 Admit Type: Outpatient Procedure:                Colonoscopy Indications:              Surveillance: Personal history of colonic polyps                            (unknown histology) on last colonoscopy 3 years ago Providers:                Carlin POUR. Cindie, DO, Emilee Tubb RN, RN, [place]                            Wilson, Technician Referring MD:              Medicines:                See the Anesthesia note for documentation of the                            administered medications Complications:            No immediate complications. Estimated Blood Loss:     Estimated blood loss was minimal. Procedure:                Pre-Anesthesia Assessment:                           - The anesthesia plan was to use monitored                            anesthesia care (MAC).                           After obtaining informed consent, the colonoscope                            was passed under direct vision. Throughout the                            procedure, the patient's blood pressure, pulse, and                            oxygen saturations were monitored continuously. The                            [id number]-HQ190L ([id number]) scope was introduced through                            the anus and advanced to the the cecum, identified                            by appendiceal orifice and ileocecal valve. The                            colonoscopy  was performed without difficulty. The                            patient tolerated the procedure well. The quality                            of the bowel preparation was evaluated using the                            BBPS [facility] Bowel Preparation Scale) with scores                            of: Right Colon = 3, Transverse Colon = 3 and Left                             Colon = 3 (entire mucosa seen well with no residual                            staining, small fragments of stool or opaque                            liquid). The total BBPS score equals 9. Scope In: 9:55:19 AM Scope Out: 10:11:48 AM Scope Withdrawal Time: 0 hours 13 minutes 44 seconds  Total Procedure Duration: 0 hours 16 minutes 29 seconds  Findings:      Non-bleeding internal hemorrhoids were found.      A few small-mouthed diverticula were found in the sigmoid colon.      A 5 mm polyp was found in the cecum. The polyp was sessile. The polyp       was removed with a cold snare. Resection and retrieval were complete.      Three sessile polyps were found in the descending colon and transverse       colon. The polyps were 3 to 5 mm in size. These polyps were removed with       a cold snare. Resection and retrieval were complete.      A 4 mm polyp was found in the rectum. The polyp was sessile. The polyp       was removed with a cold snare. Resection and retrieval were complete.      The exam was otherwise without abnormality. Impression:               - Non-bleeding internal hemorrhoids.                           - Diverticulosis in the sigmoid colon.                           - One 5 mm polyp in the cecum, removed with a cold                            snare. Resected and retrieved.                           - Three 3 to 5 mm polyps in the descending colon  and in the transverse colon, removed with a cold                            snare. Resected and retrieved.                           - One 4 mm polyp in the rectum, removed with a cold                            snare. Resected and retrieved.                           - The examination was otherwise normal. Moderate Sedation:      Per Anesthesia Care Recommendation:           - Patient has a contact number available for                            emergencies. The signs and symptoms of potential                             delayed complications were discussed with the                            patient. Return to normal activities tomorrow.                            Written discharge instructions were provided to the                            patient.                           - Resume previous diet.                           - Continue present medications.                           - Await pathology results.                           - Repeat colonoscopy in 3-5 years depending on                            pathology results for surveillance.                           - Return to GI clinic PRN. Procedure Code(s):        --- Professional ---                           [phone], Colonoscopy, flexible; with removal of                            tumor(s), polyp(s), or other lesion(s) by snare  technique Diagnosis Code(s):        --- Professional ---                           Z86.010, Personal history of colonic polyps                           K64.8, Other hemorrhoids                           D12.0, Benign neoplasm of cecum                           D12.8, Benign neoplasm of rectum                           D12.4, Benign neoplasm of descending colon                           D12.3, Benign neoplasm of transverse colon (hepatic                            flexure or splenic flexure)                           K57.30, Diverticulosis of large intestine without                            perforation or abscess without bleeding CPT copyright 2022 American Medical Association. All rights reserved. The codes documented in this report are preliminary and upon coder review may  be revised to meet current compliance requirements. Carlin POUR. Cindie, DO Carlin POUR. [name], DO 01/09/2024 10:21:16 AM This report has been signed electronically. Number of Addenda: 0

## 2024-01-09 NOTE — Anesthesia Preprocedure Evaluation (Signed)

## 2024-01-10 ENCOUNTER — Encounter (HOSPITAL_COMMUNITY): Payer: Self-pay | Admitting: Internal Medicine

## 2024-01-10 LAB — SURGICAL PATHOLOGY

## 2024-01-10 NOTE — Anesthesia Postprocedure Evaluation (Signed)
 Anesthesia Post Note  Patient: Benjamin Gill  Procedure(s) Performed: COLONOSCOPY  Patient location during evaluation: Phase II Anesthesia Type: General Level of consciousness: awake Pain management: pain level controlled Vital Signs Assessment: post-procedure vital signs reviewed and stable Respiratory status: spontaneous breathing and respiratory function stable Cardiovascular status: blood pressure returned to baseline and stable Postop Assessment: no headache and no apparent nausea or vomiting Anesthetic complications: no Comments: Late entry   No notable events documented.   Last Vitals:  Vitals:   01/09/24 0941 01/09/24 1016  BP: 102/71 (!) 97/56  Pulse: 70 78  Resp: 16 17  Temp: 36.5 C 36.5 C  SpO2: 97% 95%    Last Pain:  Vitals:   01/09/24 1016  TempSrc: Oral  PainSc: 0-No pain                 [name]

## 2024-01-16 ENCOUNTER — Ambulatory Visit: Payer: Self-pay | Admitting: Internal Medicine

## 2024-02-13 DIAGNOSIS — Z8589 Personal history of malignant neoplasm of other organs and systems: Secondary | ICD-10-CM | POA: Diagnosis not present

## 2024-02-13 DIAGNOSIS — L570 Actinic keratosis: Secondary | ICD-10-CM | POA: Diagnosis not present

## 2024-02-13 DIAGNOSIS — L814 Other melanin hyperpigmentation: Secondary | ICD-10-CM | POA: Diagnosis not present

## 2024-02-13 DIAGNOSIS — L578 Other skin changes due to chronic exposure to nonionizing radiation: Secondary | ICD-10-CM | POA: Diagnosis not present

## 2024-02-13 DIAGNOSIS — Z86018 Personal history of other benign neoplasm: Secondary | ICD-10-CM | POA: Diagnosis not present

## 2024-05-25 ENCOUNTER — Other Ambulatory Visit: Payer: Self-pay | Admitting: Internal Medicine

## 2024-05-27 NOTE — Telephone Encounter (Signed)
Pt needs appt before any more refills

## 2024-05-29 ENCOUNTER — Other Ambulatory Visit: Payer: Self-pay | Admitting: Internal Medicine
